# Patient Record
Sex: Female | Born: 1995 | Race: White | Hispanic: No | Marital: Single
Health system: Southern US, Community
[De-identification: ages and names within clinical notes are randomized; demographics above are authoritative.]

## PROBLEM LIST (undated history)

## (undated) DIAGNOSIS — R51 Headache: Secondary | ICD-10-CM

## (undated) HISTORY — DX: Headache: R51

---
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
## 2014-01-03 ENCOUNTER — Telehealth: Payer: Self-pay | Admitting: Pediatrics

## 2014-01-03 NOTE — Telephone Encounter (Signed)
Headache calendar from February 2015 on [name]. 28 days were recorded.  28 days were headache free. There is no reason to change current treatment.  Please contact the patient.

## 2014-01-03 NOTE — Telephone Encounter (Signed)
I spoke with [name] the patient's mom informing her that Dr. Sharene SkeansHickling has reviewed [name]'s February diary and there's no need to make any changes and a reminder to send in March when completed, mom agreed. MB

## 2014-02-04 ENCOUNTER — Telehealth: Payer: Self-pay | Admitting: Pediatrics

## 2014-02-04 NOTE — Telephone Encounter (Signed)
I left a message on the voicemail of [name] the patient's mom informing her that Dr. Sharene SkeansHickling has reviewed Denise Galvan's March diary, there's no need to make any changes, a reminder to send in April when complete and to call the office if she has any questions. MB

## 2014-02-04 NOTE — Telephone Encounter (Signed)
Headache calendar from March 2015 on Denise Galvan. 31 days were recorded.  31 days were headache free.  There is no reason to change current treatment.  Please contact the patient.

## 2014-03-09 ENCOUNTER — Telehealth: Payer: Self-pay | Admitting: Family

## 2014-03-09 NOTE — Telephone Encounter (Signed)
[facility] There were no headaches or migraines recorded for the month of April. Please let Aymara and her mother know that there is no reason to change current treatment. Thank you, Inetta Fermoina

## 2014-03-10 NOTE — Telephone Encounter (Signed)
I left a message on home number regarding headache diary and asked patient or her mother to call back if they had questions or concerns. TG

## 2014-04-05 ENCOUNTER — Telehealth: Payer: Self-pay | Admitting: *Deleted

## 2014-04-05 ENCOUNTER — Encounter: Payer: Self-pay | Admitting: Family

## 2014-04-05 NOTE — Telephone Encounter (Signed)
Denise Galvan, mom, called stating the pt needs a docor's note. The mother stated the pt is going to [employer] in the fall. The mother is trying to get the pt in a certain housing. The letter needs to state that the pt needs to be close to the restroom due to her migraines when she gets sick. The mother spoke to the director of housing at [employer] about getting the doctor's note to him so he can do his best to get her in the right housing. The school is currently in the process of assigning rooms. The mother can be reached at [phone].

## 2014-04-05 NOTE — Telephone Encounter (Signed)
Thanks

## 2014-04-05 NOTE — Telephone Encounter (Signed)
I wrote a letter and emailed it to [employer]. She will call me back if she needs anything else. TG

## 2014-04-06 ENCOUNTER — Telehealth: Payer: Self-pay | Admitting: Pediatrics

## 2014-04-06 NOTE — Telephone Encounter (Signed)
I spoke with Denise Galvan the patient's mom informing her that Dr. Sharene Skeans has reviewed Denise Galvan's May diary and that there's no need to make any changes and a reminder to send in June when complete, mom agreed. MB

## 2014-04-06 NOTE — Telephone Encounter (Signed)
Headache calendar from May 2015 on [place]. 31 days were recorded.  31 days were headache free. There is no reason to change current treatment.  Please contact the patient.

## 2014-05-10 ENCOUNTER — Telehealth: Payer: Self-pay | Admitting: Pediatrics

## 2014-05-10 NOTE — Telephone Encounter (Signed)
Headache calendar from June 2015 on [place]. 30 days were recorded.  29 days were headache free. There was 1 day of migraines, 1 was severe. This came on suddenly pain was holocephalic.  The quality was stabbing and throbbing, 8 on a scale of 10, patient only had 7 hours of sleep the night before, was on her menstrual cycle and had consumed lots of red dye 40, caffeine, and MSG prior to the headache.  She did was not able to take medication because it came on suddenly and had to go to bed. There is no reason to change current treatment.  Please contact the family.

## 2014-05-10 NOTE — Telephone Encounter (Signed)
I spoke with Denise Galvan the patient's mom informing her that Dr. Sharene SkeansHickling has reviewed Denise Galvan's June diary and there's no need to make any changes and a reminder to send in July when complete, mom agreed. MB

## 2014-06-06 ENCOUNTER — Telehealth: Payer: Self-pay | Admitting: Pediatrics

## 2014-06-07 NOTE — Telephone Encounter (Signed)
Headache calendar from July 2015 on [place]. 31 days were recorded.  31 days were headache free.  * There is no reason to change current treatment.  Please contact the family.

## 2014-06-08 NOTE — Telephone Encounter (Signed)
I spoke with [name] the patient's mom informing her that Dr. Sharene SkeansHickling has reviewed Dejah's July diary and there's no need to make any changes and a reminder to send in August when complete, mom agreed. MB

## 2014-07-09 ENCOUNTER — Telehealth: Payer: Self-pay | Admitting: Pediatrics

## 2014-07-09 NOTE — Telephone Encounter (Signed)
Headache calendar from August 2015 on [place]. 31 days were recorded.  31 days were headache free. There is no reason to change current treatment.  Please contact the family.

## 2014-07-13 NOTE — Telephone Encounter (Signed)
I spoke with Denise Galvan the patient's mom informing her that Dr. Sharene Skeans has reviewed Denise Galvan's August diary and there's no need to make any changes and a reminder to send in September when complete, mom agreed. MB

## 2014-09-11 ENCOUNTER — Telehealth: Payer: Self-pay | Admitting: Pediatrics

## 2014-09-11 NOTE — Telephone Encounter (Signed)
Headache calendar from October 2015 on Denise Galvan. 31 days were recorded.  31 days were headache free.  There is no reason to change current treatment.  Please contact the patient.

## 2014-09-12 NOTE — Telephone Encounter (Signed)
I left a message informing patient that Dr. Sharene SkeansHickling has reviewed her headache diary for October and there's no need to make any changes, a reminder to send in November when complete and to call the office if she has any questions. MB

## 2014-11-01 ENCOUNTER — Ambulatory Visit: Payer: Managed Care, Other (non HMO) | Admitting: Family

## 2014-11-07 ENCOUNTER — Encounter: Payer: Self-pay | Admitting: Family

## 2014-11-07 ENCOUNTER — Ambulatory Visit (INDEPENDENT_AMBULATORY_CARE_PROVIDER_SITE_OTHER): Payer: BC Managed Care – PPO | Admitting: Family

## 2014-11-07 VITALS — BP 100/74 | HR 80 | Ht 63.75 in | Wt 132.0 lb

## 2014-11-07 DIAGNOSIS — G44219 Episodic tension-type headache, not intractable: Secondary | ICD-10-CM

## 2014-11-07 DIAGNOSIS — G43109 Migraine with aura, not intractable, without status migrainosus: Secondary | ICD-10-CM

## 2014-11-07 NOTE — Progress Notes (Signed)
Patient: Denise Galvan MRN: [id number] Sex: female DOB: 1996/08/21  Provider: Elveria Rising, NP Location of Care: [facility] Child Neurology  Note type: Routine return visit  History of Present Illness: Referral Source: Dr. Cyril Mourning History from: patient Chief Complaint: Migraines/Headaches  Denise Galvan is a 19 y.o. young woman with history of headaches that have been present since August 2009. She was last seen November 03, 2013. Denise Galvan tells me today that her migraines have not been problematic. She says that her last migraine occurred on November 04, 2014, but she believes that it occurred due to alcohol intake on New Year's Eve. The last migraine prior to that was several months prior to that. When she has a migraine, [name] usually works to relieve the it when she takes it early enough in the migraine event. She also becomes fairly nauseated and vomits with her migraines. Zofran helps to control the vomiting. Denise Galvan has occasional tension headaches that are not severe.   Denise Galvan has been healthy since last seen. 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    Review of Systems: 12 system review was unremarkable  Past Medical History  Diagnosis Date  . Headache(784.0)    Hospitalizations: No., Head Injury: No., Nervous System Infections: No., Immunizations up to date: Yes.   Past Medical History Comments: Denise Galvan's headaches have been present since August 2009. She has migraine with aura involving a visual aura on the left that is probably left visual field lasting 10- 20 minutes for headaches and occurs 100% of the time. Headaches are at the vertex and temples associated with throbbing pain when severe,steady pain when moderate. The tend to occur in the morning and can last for hours. She's never had a headache begin in the middle of the night. She has taken and tolerated an herbal vitamin without side effects. Dr Denise Galvan gave her [name] for  rescue treatment and she reports that when she takes it at the onset of the migraine that she gets relief within 20-30 minutes but she still needs to lie down during that time. If she does not take it at the onset, the migraine still persists for hours. Her mother feels that there is some relationship to her menstrual cycle and that those migraines are more resistant to treatment. Her menses are irregular but she has had some migraines coincide with the onset of the period.   Surgical History History reviewed. No pertinent past surgical history.  Family History family history includes Bladder Cancer in her maternal grandfather; Diabetes in her maternal grandmother; Heart attack in her paternal grandfather; Heart disease in her maternal grandmother; Hyperlipidemia in her father and paternal grandmother; Migraines in her father, maternal aunt, maternal grandmother, and mother; Other in her father and paternal grandfather; Stroke in her paternal grandfather. Family History is otherwise negative for migraines, seizures, cognitive impairment, blindness, deafness, birth defects, chromosomal disorder, autism.  Social History History   Social History  . Marital Status: Single    Spouse Name: N/A    Number of Children: N/A  . Years of Education: N/A   Social History Main Topics  . Smoking status: Never Smoker   . Smokeless tobacco: Never Used  . Alcohol Use: No  . Drug Use: No  . Sexual Activity: No   Other Topics Concern  . None   Social History Narrative   Educational level: university School Attending:[employer] with:  both parents  Hobbies/Interest: basketball  and softball School comments:  Denise Galvan is doing well in school. This is her first year in college and is studying biology.  Physical Exam BP 100/74 mmHg  Pulse 80  Ht 5' 3.75" (1.619 m)  Wt 132 lb (59.875 kg)  BMI 22.84 kg/m2  LMP 11/06/2014 (Exact Date) General: alert, well developed,  well nourished, adolescent girl, in no acute distress Head: normocephalic, no dysmorphic features Ears, Nose and Throat: Otoscopic: tympanic membranes normal . Pharynx: oropharynx is pink without exudates or tonsillar hypertrophy. Neck: supple, full range of motion, no cranial or cervical bruits Respiratory: auscultation clear Cardiovascular: no murmurs, pulses are normal Musculoskeletal: no skeletal deformities or apparent scoliosis Skin: no rashes or neurocutaneous lesions Trunk: no limb deformities or scoliosis  Neurologic Exam  Mental Status: alert; oriented to person, place, and year; knowledge is normal for age; language is normal Cranial Nerves: visual fields are full to double simultaneous stimuli; extraocular movements are full and conjugate; pupils are round reactive to light; funduscopic examination shows sharp disc margins with normal vessels; symmetric facial strength; midline tongue and uvula; hearing intact and symmetric Motor: Normal strength, tone, and mass; good fine motor movements; no pronator drift. Sensory: intact responses to cold, vibration, proprioception and stereognosis  Coordination: good finger-to-nose, rapid repetitive alternating movements and finger apposition  Gait and Station: normal gait and station; patient is able to walk on heels, toes and tandem without difficulty; balance is adequate; Romberg exam is negative; Gower response is negative Reflexes: symmetric and diminished bilaterally; no clonus; bilateral flexor plantar responses.   Assessment and Plan Denise Galvan is a 19 year old young woman with history of headaches that have been present since August 2009. She has occasional tension and migraine headaches. When she has a migraine, [name] works to give her relief. I asked her to continue to keep headache diaries and to send them in monthly. I will see Denise Galvan back in follow up in 6 months or sooner if needed.

## 2014-11-07 NOTE — Patient Instructions (Signed)
Continue keeping headache diaires. Dr. Sharene Skeans or I will call you when we receive diaries to discuss them. Remember to avoid skipping meals, and remember to get enough sleep and to drink enough water. These things will help you to have fewer headaches.   I will see you back in follow up in 6 months or sooner if needed.

## 2014-11-09 ENCOUNTER — Encounter: Payer: Self-pay | Admitting: Family

## 2014-12-05 ENCOUNTER — Telehealth: Payer: Self-pay | Admitting: Pediatrics

## 2014-12-05 NOTE — Telephone Encounter (Signed)
Headache calendar from January 2016 on Denise Galvan. 31 days were recorded.  30 days were headache free. There was 1 day of migraines, none were severe.  There is no reason to change current treatment.  Please contact the family.

## 2014-12-06 NOTE — Telephone Encounter (Signed)
I spoke with [name] the patients mom informing her that Dr. Sharene SkeansHickling has reviewed Denise Galvan's January dairy and there's no need to make any changes and a reminder to send in Feb. When complete, mom agreed. MB

## 2015-01-08 ENCOUNTER — Telehealth: Payer: Self-pay | Admitting: Pediatrics

## 2015-01-08 NOTE — Telephone Encounter (Signed)
Headache calendar from February 2016 on Denise Galvan. 29 days were recorded.  29 days were headache free.  There is no reason to change current treatment.  Please contact the patient.

## 2015-01-09 NOTE — Telephone Encounter (Signed)
I left a voicemail message for [name] the patients mom informing her that Dr. Sharene SkeansHickling has reviewed Mykeisha's February diary and there's no need to make any changes, a reminder to send in March when complete and to call the office if she has any questions. MB

## 2015-02-08 ENCOUNTER — Telehealth: Payer: Self-pay | Admitting: Pediatrics

## 2015-02-08 NOTE — Telephone Encounter (Signed)
Headache calendar from March 2016 on [place]. 31 days were recorded.  30 days were headache free.  There was 1 day of migraines, 1 was severe.  There is no reason to change current treatment.  Please contact the patient.

## 2015-02-13 NOTE — Telephone Encounter (Signed)
I spoke with patient informing her that Dr. Sharene SkeansHickling has reviewed her diary and there's no need to make any changes and a reminder to send in April when complete and she agreed. MB

## 2015-03-18 ENCOUNTER — Telehealth: Payer: Self-pay | Admitting: Pediatrics

## 2015-03-18 NOTE — Telephone Encounter (Signed)
Headache calendar from April 2016 on Denise Galvan. 30 days were recorded.  30 days were headache free. There is no reason to change current treatment.  Please contact the patient.

## 2015-03-21 NOTE — Telephone Encounter (Signed)
I spoke with [name] the patients mom informing her that Dr. Sharene SkeansHickling has reviewed Denise Galvan's April diary and there's no need to make any changes and a reminder to send in May when complete, mom agreed. MB

## 2015-04-06 ENCOUNTER — Telehealth: Payer: Self-pay | Admitting: Pediatrics

## 2015-04-06 NOTE — Telephone Encounter (Signed)
Headache calendar from May 2016 on Denise Galvan. 31 days were recorded.  31 days were headache free. There is no reason to change current treatment.  Please contact the patient.

## 2015-04-21 NOTE — Telephone Encounter (Signed)
I spoke with [name] the patients mom informing her that Dr. Sharene Skeans has reviewed Denise Galvan's May diary and there's no need to make any changes and a reminder to send in June when completed, mom agreed. MB

## 2015-05-09 ENCOUNTER — Ambulatory Visit (INDEPENDENT_AMBULATORY_CARE_PROVIDER_SITE_OTHER): Payer: BLUE CROSS/BLUE SHIELD | Admitting: Family

## 2015-05-09 VITALS — BP 104/74 | HR 90 | Ht 63.5 in | Wt 137.8 lb

## 2015-05-09 DIAGNOSIS — G44219 Episodic tension-type headache, not intractable: Secondary | ICD-10-CM | POA: Diagnosis not present

## 2015-05-09 DIAGNOSIS — G43109 Migraine with aura, not intractable, without status migrainosus: Secondary | ICD-10-CM

## 2015-05-09 NOTE — Progress Notes (Signed)
Patient: Denise Galvan MRN: [id number] Sex: female DOB: Nov 05, 1995  Provider: Elveria Rising, NP Location of Care: [facility] Neurology  Note type: Routine return visit  History of Present Illness: Referral Source: Dr. Cyril Mourning History from: patient and [facility] chart Chief Complaint: Migraines/ Headaches  Denise Galvan is a 19 y.o. girl with history of migraine with aura and tension headaches that have been present since August 2009. She was last seen November 07, 2014. Denise Galvan tells me today that her migraines have not been problematic. She has a headache diary that shows no headaches in the month of June. She had a migraine in February and 1 in April.  When she has a migraine, [name] usually works to relieve the it when she takes it early enough in the migraine event. She also becomes fairly nauseated and vomits with her migraines. Zofran helps to control the vomiting. Denise Galvan has occasional tension headaches that are not severe.   Denise Galvan has been healthy since last seen and has no other health concerns. 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 She will be working during the fall, then starting classes again in January. 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   Review of Systems: Please see the HPI for neurologic and other pertinent review of systems. Otherwise, the following systems are noncontributory including constitutional, eyes, ears, nose and throat, cardiovascular, respiratory, gastrointestinal, genitourinary, musculoskeletal, skin, endocrine, hematologic/lymph, allergic/immunologic and psychiatric.   Past Medical History  Diagnosis Date  . Headache(784.0)    Hospitalizations: No., Head Injury: No., Nervous System Infections: No., Immunizations up to date: Yes.   Past Medical History Comments: Denise Galvan's headaches have been present since August 2009. She has migraine with aura involving a visual  aura on the left that is probably left visual field lasting 10- 20 minutes for headaches and occurs 100% of the time. Headaches are at the vertex and temples associated with throbbing pain when severe,steady pain when moderate. The tend to occur in the morning and can last for hours. She's never had a headache begin in the middle of the night. She has taken and tolerated an herbal vitamin without side effects. Dr Sharene Skeans gave her [name] for rescue treatment and she reports that when she takes it at the onset of the migraine that she gets relief within 20-30 minutes but she still needs to lie down during that time. If she does not take it at the onset, the migraine still persists for hours. Her mother feels that there is some relationship to her menstrual cycle and that those migraines are more resistant to treatment. Her menses are irregular but she has had some migraines coincide with the onset of the period.   Surgical History No past surgical history on file.  Family History family history includes Bladder Cancer in her maternal grandfather; Diabetes in her maternal grandmother; Heart attack in her paternal grandfather; Heart disease in her maternal grandmother; Hyperlipidemia in her father and paternal grandmother; Migraines in her father, maternal aunt, maternal grandmother, and mother; Other in her father and paternal grandfather; Stroke in her paternal grandfather. Family History is otherwise negative for migraines, seizures, cognitive impairment, blindness, deafness, birth defects, chromosomal disorder, autism.  Social History History   Social History  . Marital Status: Single    Spouse Name: N/A  . Number of Children: N/A  . Years of Education: N/A   Social History Main Topics  . Smoking  status: Never Smoker   . Smokeless tobacco: Never Used  . Alcohol Use: No  . Drug Use: No  . Sexual Activity: No   Other Topics Concern  . Not on file   Social History Narrative    Educational level: Not enrolled   Living with:  both parents  Hobbies/Interest: Denise Galvan enjoys work and [employer].  School comments: Denise Galvan is not currently enrolled in school and is working at [employer].  Allergies No Known Allergies  Physical Exam BP 104/74 mmHg  Pulse 90  Ht 5' 3.5" (1.613 m)  Wt 137 lb 12.8 oz (62.506 kg)  BMI 24.02 kg/m2  LMP 04/24/2015 General: alert, well developed, well nourished girl, in no acute distress Head: normocephalic, no dysmorphic features Ears, Nose and Throat: Otoscopic: tympanic membranes normal . Pharynx: oropharynx is pink without exudates or tonsillar hypertrophy. Neck: supple, full range of motion, no cranial or cervical bruits Respiratory: auscultation clear Cardiovascular: no murmurs, pulses are normal Musculoskeletal: no skeletal deformities or apparent scoliosis Skin: no rashes or neurocutaneous lesions Trunk: no limb deformities or scoliosis  Neurologic Exam  Mental Status: alert; oriented to person, place, and year; knowledge is normal for age; language is normal Cranial Nerves: visual fields are full to double simultaneous stimuli; extraocular movements are full and conjugate; pupils are round reactive to light; funduscopic examination shows sharp disc margins with normal vessels; symmetric facial strength; midline tongue and uvula; hearing intact and symmetric Motor: Normal strength, tone, and mass; good fine motor movements; no pronator drift. Sensory: intact responses to touch and temperature Coordination: good finger-to-nose, rapid repetitive alternating movements and finger apposition  Gait and Station: normal gait and station; patient is able to walk on heels, toes and tandem without difficulty; balance is adequate; Romberg exam is negative; Gower response is negative Reflexes: symmetric and diminished bilaterally; no clonus; bilateral flexor plantar responses.  Impression 1. Migraine with aura 2.  Episodic tension headaches  Recommendations for plan of care [facility]. Lachele has neither had nor required imaging or lab studies since the last visit. She is a 19 year old young woman with history of tension headaches and migraine with aura. When she has a migraine, [name] works to give her relief. I asked her to continue to keep headache diaries and to send them in monthly. I will see Jacquita back in follow up in 6 months or sooner if needed.   The medication list was reviewed and reconciled.  No changes were made in the prescribed medications today.  A complete medication list was provided to the patient.   Total time spent with the patient was 15 minutes, of which 50% or more was spent in counseling and coordination of care.

## 2015-05-11 ENCOUNTER — Encounter: Payer: Self-pay | Admitting: Family

## 2015-05-11 NOTE — Patient Instructions (Signed)
Continue to keep headache diaries and send them in monthly. Let me know if your headaches become more frequent or more severe.   Continue to take [name] for migraine relief as you have been doing.   Please plan to return for follow up in January or sooner if needed.

## 2015-06-06 ENCOUNTER — Telehealth: Payer: Self-pay | Admitting: Pediatrics

## 2015-06-06 NOTE — Telephone Encounter (Signed)
Headache calendar from July 2016 on [place]. 31 days were recorded.  31 days were headache free.  There is no reason to change current treatment.  Please contact the patient.

## 2015-06-07 NOTE — Telephone Encounter (Signed)
No, I can't remember the last time she had a migraine.

## 2015-06-07 NOTE — Telephone Encounter (Signed)
I spoke with Denise Galvan the patients mom informing her that Dr. Sharene Skeans has reviewed Denise Galvan's July diary and there's no need to make any changes and a reminder to send in August when complete, mom agreed but had a question, she wants to know if it's necessary for Denise Galvan to continue sending in headache diaries? I informed mom that I would speak with Dr. Sharene Skeans about it and I would get back in contact with her to let her know, she confirmed understanding of our conversation. MB

## 2016-02-09 ENCOUNTER — Encounter: Payer: Self-pay | Admitting: Family

## 2016-03-05 ENCOUNTER — Encounter: Payer: Self-pay | Admitting: Family

## 2016-03-05 ENCOUNTER — Ambulatory Visit (INDEPENDENT_AMBULATORY_CARE_PROVIDER_SITE_OTHER): Payer: BLUE CROSS/BLUE SHIELD | Admitting: Family

## 2016-03-05 VITALS — BP 110/70 | HR 84 | Ht 63.75 in | Wt 142.6 lb

## 2016-03-05 DIAGNOSIS — G44219 Episodic tension-type headache, not intractable: Secondary | ICD-10-CM

## 2016-03-05 DIAGNOSIS — G43109 Migraine with aura, not intractable, without status migrainosus: Secondary | ICD-10-CM

## 2016-03-05 NOTE — Progress Notes (Signed)
Patient: Denise Galvan MRN: [id number] Sex: female DOB: 12/09/1995  Provider: Elveria Risingina Tremain Rucinski, NP [facility]  Note type: Routine return visit  History of Present Illness: Referral Source: Dr. Cyril MourningJack Amos History from: patient, referring office and [facility] Chief Complaint: Migraine  Denise Galvan is a 20 y.o. young woman with history of migraine with aura and tension headaches that have been present since August 2009. She was last seen May 09, 2015. Denise Galvan tells me today that sje has only had 1 or 2 migraines in the past year. When she has a migraine, Excedrin Migraine usually works to relieve the it when she takes it early enough in the migraine event. She has a prescription for [name] but tends to take it only when the migraine is severe. She is unable to identify any specific triggers for her migraines but believes that some may be triggered by her diet or by her menstrual cycle. Denise Galvan has occasional tension headaches that are not severe.   Denise Galvan has been otherwise healthy since last seen and has no other health concerns. She is taking a semester off from college but plans to return in the fall. Denise Galvan is working at a [employer] and plans to continue working there part time when she returns to school.  Review of Systems: Please see the HPI for neurologic and other pertinent review of systems. Otherwise, the following systems are noncontributory including constitutional, eyes, ears, nose and throat, cardiovascular, respiratory, gastrointestinal, genitourinary, musculoskeletal, skin, endocrine, hematologic/lymph, allergic/immunologic and psychiatric.   Past Medical History  Diagnosis Date  . Headache(784.0)    Hospitalizations: No., Head Injury: No., Nervous System Infections: No., Immunizations up to date: Yes.   Past Medical History Comments: Denise Galvan's headaches have been present since August 2009. She has migraine with aura involving a  visual aura on the left that is probably left visual field lasting 10- 20 minutes for headaches and occurs 100% of the time. Headaches are at the vertex and temples associated with throbbing pain when severe,steady pain when moderate. The tend to occur in the morning and can last for hours. She's never had a headache begin in the middle of the night. She has taken and tolerated an herbal vitamin without side effects. Dr Sharene SkeansHickling gave her [name] for rescue treatment and she reports that when she takes it at the onset of the migraine that she gets relief within 20-30 minutes but she still needs to lie down during that time. If she does not take it at the onset, the migraine still persists for hours. Her mother feels that there is some relationship to her menstrual cycle and that those migraines are more resistant to treatment. Her menses are irregular but she has had some migraines coincide with the onset of the period.    Surgical History History reviewed. No pertinent past surgical history.  Family History family history includes Bladder Cancer in her maternal grandfather; Diabetes in her maternal grandmother; Heart attack in her paternal grandfather; Heart disease in her maternal grandmother; Hyperlipidemia in her father and paternal grandmother; Migraines in her father, maternal aunt, maternal grandmother, and mother; Other in her father and paternal grandfather; Stroke in her paternal grandfather. Family History is otherwise negative for migraines, seizures, cognitive impairment, blindness, deafness, birth defects, chromosomal disorder, autism.  Social History Social History   Social History  . Marital Status: Single    Spouse Name: N/A  . Number of Children: N/A  . Years of Education:  N/A   Social History Main Topics  . Smoking status: Passive Smoke Exposure - Never Smoker  . Smokeless tobacco: Never Used  . Alcohol Use: No  . Drug Use: No  . Sexual Activity: Yes    Birth Control/  Protection: IUD     Comment: Mother smokes   Other Topics Concern  . 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   Lives with her parents and sibling.    Allergies No Known Allergies  Physical Exam BP 110/70 mmHg  Pulse 84  Ht 5' 3.75" (1.619 m)  Wt 142 lb 9.6 oz (64.683 kg)  BMI 24.68 kg/m2  LMP 02/20/2016 (Exact Date) General: alert, well developed, well nourished girl, in no acute distress Head: normocephalic, no dysmorphic features Ears, Nose and Throat: Otoscopic: tympanic membranes normal . Pharynx: oropharynx is pink without exudates or tonsillar hypertrophy. Neck: supple, full range of motion, no cranial or cervical bruits Respiratory: auscultation clear Cardiovascular: no murmurs, pulses are normal Musculoskeletal: no skeletal deformities or apparent scoliosis Skin: no rashes or neurocutaneous lesions Trunk: no limb deformities or scoliosis  Neurologic Exam  Mental Status: alert; oriented to person, place, and year; knowledge is normal for age; language is normal Cranial Nerves: visual fields are full to double simultaneous stimuli; extraocular movements are full and conjugate; pupils are round reactive to light; funduscopic examination shows sharp disc margins with normal vessels; symmetric facial strength; midline tongue and uvula; hearing intact and symmetric Motor: Normal strength, tone, and mass; good fine motor movements; no pronator drift. Sensory: intact responses to touch and temperature Coordination: good finger-to-nose, rapid repetitive alternating movements and finger apposition  Gait and Station: normal gait and station; patient is able to walk on heels, toes and tandem without difficulty; balance is adequate; Romberg exam is negative; Gower response is negative Reflexes: symmetric and diminished  bilaterally; no clonus; bilateral flexor plantar responses.  Impression 1. Migraine with aura 2. Episodic tension headaches  Recommendations for plan of care The patient's previous [facility] records were reviewed. Ailen has neither had nor required imaging or lab studies since the last visit. She is a 20 year old young woman with history of tension headaches and migraine with aura. She estimates that she has only had 1 or 2 migraines in the past year. When she has a migraine, Excedrin Migraine gives her relief in a short period of time. I talked with Denise Galvan about her headaches and reminded her of usual triggers such as skipped meals, not drinking enough water, insufficient sleep and stress. I asked Denise Galvan to let me know if her migraines became more frequent or more severe. Since she is currently doing well, I will see her back in follow up in 1 year or sooner if needed. Denise Galvan agreed with these plans.   The medication list was reviewed and reconciled.  No changes were made in the prescribed medications today.  A complete medication list was provided to the patient.    Medication List       This list is accurate as of: 03/05/16  3:11 PM.  Always use your most recent med list.               EXCEDRIN MIGRAINE 250-250-65 MG tablet  Generic drug:  aspirin-acetaminophen-caffeine  Take 2 tablets by mouth every 6 (six) hours as needed for headache.  rizatriptan 10 MG tablet  Commonly known as:  [name]  Take 10 mg by mouth as needed for migraine. May repeat in 2 hours if needed     SKYLA 13.5 MG Iud  Generic drug:  Levonorgestrel  by Intrauterine route.        Total time spent with the patient was 15 minutes, of which 50% or more was spent in counseling and coordination of care.   Elveria Rising

## 2016-03-05 NOTE — Patient Instructions (Addendum)
I am pleased that your migraines are infrequent at this time. Let me know if your headaches become more frequent or more severe.   Remember to try to avoid typical triggers for migraines such as skipping meals, not drinking enough water, not getting enough sleep (or sleeping too much), and stress.   Consider signing up for MyChart - your online access to your electronic medical record.   Please plan on returning for follow up in 1 year or sooner if needed.

## 2016-04-16 DIAGNOSIS — N760 Acute vaginitis: Secondary | ICD-10-CM | POA: Diagnosis not present

## 2016-04-16 DIAGNOSIS — Z6824 Body mass index (BMI) 24.0-24.9, adult: Secondary | ICD-10-CM | POA: Diagnosis not present

## 2016-04-16 DIAGNOSIS — Z01419 Encounter for gynecological examination (general) (routine) without abnormal findings: Secondary | ICD-10-CM | POA: Diagnosis not present

## 2016-06-25 DIAGNOSIS — N390 Urinary tract infection, site not specified: Secondary | ICD-10-CM | POA: Diagnosis not present

## 2016-06-25 DIAGNOSIS — N760 Acute vaginitis: Secondary | ICD-10-CM | POA: Diagnosis not present

## 2017-05-28 DIAGNOSIS — Z30433 Encounter for removal and reinsertion of intrauterine contraceptive device: Secondary | ICD-10-CM | POA: Diagnosis not present

## 2017-06-20 DIAGNOSIS — Z30431 Encounter for routine checking of intrauterine contraceptive device: Secondary | ICD-10-CM | POA: Diagnosis not present

## 2017-06-20 DIAGNOSIS — R1032 Left lower quadrant pain: Secondary | ICD-10-CM | POA: Diagnosis not present

## 2017-07-04 DIAGNOSIS — N83201 Unspecified ovarian cyst, right side: Secondary | ICD-10-CM | POA: Diagnosis not present

## 2017-10-23 DIAGNOSIS — Z01419 Encounter for gynecological examination (general) (routine) without abnormal findings: Secondary | ICD-10-CM | POA: Diagnosis not present

## 2017-10-23 DIAGNOSIS — Z6826 Body mass index (BMI) 26.0-26.9, adult: Secondary | ICD-10-CM | POA: Diagnosis not present

## 2017-10-23 DIAGNOSIS — Z113 Encounter for screening for infections with a predominantly sexual mode of transmission: Secondary | ICD-10-CM | POA: Diagnosis not present

## 2017-10-23 LAB — HM PAP SMEAR: HM Pap smear: NEGATIVE

## 2018-01-02 DIAGNOSIS — L700 Acne vulgaris: Secondary | ICD-10-CM | POA: Diagnosis not present

## 2018-01-02 DIAGNOSIS — D235 Other benign neoplasm of skin of trunk: Secondary | ICD-10-CM | POA: Diagnosis not present

## 2018-01-26 DIAGNOSIS — N946 Dysmenorrhea, unspecified: Secondary | ICD-10-CM | POA: Diagnosis not present

## 2018-01-26 DIAGNOSIS — G43909 Migraine, unspecified, not intractable, without status migrainosus: Secondary | ICD-10-CM | POA: Diagnosis not present

## 2018-01-26 DIAGNOSIS — Z3009 Encounter for other general counseling and advice on contraception: Secondary | ICD-10-CM | POA: Diagnosis not present

## 2018-02-25 ENCOUNTER — Other Ambulatory Visit: Payer: Self-pay | Admitting: Family Medicine

## 2018-02-25 DIAGNOSIS — R109 Unspecified abdominal pain: Secondary | ICD-10-CM | POA: Diagnosis not present

## 2018-02-25 DIAGNOSIS — R102 Pelvic and perineal pain: Secondary | ICD-10-CM

## 2018-02-26 ENCOUNTER — Ambulatory Visit
Admission: RE | Admit: 2018-02-26 | Discharge: 2018-02-26 | Disposition: A | Discharge status: Home / Self Care | Payer: BLUE CROSS/BLUE SHIELD | Source: Ambulatory Visit | Attending: Family Medicine | Admitting: Family Medicine

## 2018-02-26 DIAGNOSIS — R102 Pelvic and perineal pain: Secondary | ICD-10-CM

## 2018-02-26 DIAGNOSIS — N83202 Unspecified ovarian cyst, left side: Secondary | ICD-10-CM | POA: Diagnosis not present

## 2018-03-13 ENCOUNTER — Telehealth: Payer: Self-pay | Admitting: Nurse Practitioner

## 2018-03-13 NOTE — Telephone Encounter (Signed)
Copied from CRM [id number]. Topic: Appointment Scheduling - Scheduling Inquiry for Clinic >> Mar 13, 2018  1:29 PM Diana Eves B wrote: Reason for CRM: pt's mom is calling in to see if Apolonio Schneiders would consider taking on her daughter as a new pt. Her other daughter is already a patient of [facility]leigh's.

## 2018-03-14 NOTE — Telephone Encounter (Signed)
Yes I will be glad to 

## 2018-03-16 NOTE — Telephone Encounter (Signed)
Appointment has been made for 7/12 @ 10am. - New patient paperwork has been mailed.

## 2018-04-10 DIAGNOSIS — L700 Acne vulgaris: Secondary | ICD-10-CM | POA: Diagnosis not present

## 2018-04-16 DIAGNOSIS — Z3009 Encounter for other general counseling and advice on contraception: Secondary | ICD-10-CM | POA: Diagnosis not present

## 2018-04-16 DIAGNOSIS — Z30432 Encounter for removal of intrauterine contraceptive device: Secondary | ICD-10-CM | POA: Diagnosis not present

## 2018-04-16 DIAGNOSIS — G43101 Migraine with aura, not intractable, with status migrainosus: Secondary | ICD-10-CM | POA: Diagnosis not present

## 2018-04-16 DIAGNOSIS — Z30013 Encounter for initial prescription of injectable contraceptive: Secondary | ICD-10-CM | POA: Diagnosis not present

## 2018-05-15 ENCOUNTER — Ambulatory Visit: Payer: BLUE CROSS/BLUE SHIELD | Admitting: Nurse Practitioner

## 2018-05-28 ENCOUNTER — Encounter: Payer: Self-pay | Admitting: Nurse Practitioner

## 2018-05-28 ENCOUNTER — Ambulatory Visit: Payer: BLUE CROSS/BLUE SHIELD | Admitting: Nurse Practitioner

## 2018-05-28 VITALS — BP 108/70 | HR 70 | Temp 98.6°F | Resp 16 | Ht 63.75 in | Wt 148.0 lb

## 2018-05-28 DIAGNOSIS — M545 Low back pain, unspecified: Secondary | ICD-10-CM | POA: Insufficient documentation

## 2018-05-28 DIAGNOSIS — G43109 Migraine with aura, not intractable, without status migrainosus: Secondary | ICD-10-CM

## 2018-05-28 DIAGNOSIS — L709 Acne, unspecified: Secondary | ICD-10-CM | POA: Insufficient documentation

## 2018-05-28 DIAGNOSIS — F419 Anxiety disorder, unspecified: Secondary | ICD-10-CM | POA: Insufficient documentation

## 2018-05-28 NOTE — Patient Instructions (Signed)
Please Return at your convenience for an annual physical or as needed for any problems  It was nice to meet you. Welcome to [facility]!

## 2018-05-28 NOTE — Assessment & Plan Note (Signed)
Continue routine dermatology follow up and as needed for new, worsening symptoms

## 2018-05-28 NOTE — Assessment & Plan Note (Signed)
Stable Continue routine neurology follow up and as needed for new, worsening symptoms

## 2018-05-28 NOTE — Progress Notes (Signed)
Name: Denise Galvan   MRN: [id number]    DOB: 1996/10/25   Date:05/28/2018       Progress Note  Subjective  Chief Complaint  Chief Complaint  Patient presents with  . Establish Care    HPI [name] is here today to establish care as a new patient to our practice, transferring from another PCP due to desire for female provider. Aside from primary care, she is routinely followed by GYN for routine womens care and birth control management, currently maintained on depo-provera; dermatology for management of acne, currently maintained on doxycyline; and neurology for migraines with aura, currently maintained off prescription medications. She Reports long history of migraines which have greatly improved over recent years. She is maintained on ibuprofen PRN for the migraines, but usually does not take any medication at all and the migraines will improve with time and rest.  Patient Active Problem List   Diagnosis Date Noted  . Migraine with aura 11/01/2013  . Episodic tension type headache 11/01/2013    No past surgical history on file.  Family History  Problem Relation Age of Onset  . Hyperlipidemia Father   . Migraines Father   . Other Father        Dyslipidemia  . Heart disease Maternal Grandmother   . Migraines Maternal Grandmother   . Diabetes Maternal Grandmother   . Hyperlipidemia Paternal Grandmother   . Migraines Mother        Onset between the age of 42 or 59  . Bladder Cancer Maternal Grandfather        Died at 24  . Heart attack Paternal Grandfather        Died at 76  . Other Paternal Grandfather        Staph infection  . Stroke Paternal Grandfather   . Migraines Maternal Aunt     Social History   Socioeconomic History  . Marital status: Single    Spouse name: Not on file  . Number of children: Not on file  . Years of education: Not on file  . Highest education level: Not on file  Occupational History  . Not on file  Social Needs  . Financial resource  strain: Not on file  . Food insecurity:    Worry: Not on file    Inability: Not on file  . Transportation needs:    Medical: Not on file    Non-medical: Not on file  Tobacco Use  . Smoking status: Passive Smoke Exposure - Never Smoker  . Smokeless tobacco: Never Used  Substance and Sexual Activity  . Alcohol use: No    Alcohol/week: 0.0 oz  . Drug use: No  . Sexual activity: Yes    Birth control/protection: IUD    Comment: Mother smokes  Lifestyle  . Physical activity:    Days per week: Not on file    Minutes per session: Not on file  . Stress: Not on file  Relationships  . Social connections:    Talks on phone: Not on file    Gets together: Not on file    Attends religious service: Not on file    Active member of club or organization: Not on file    Attends meetings of clubs or organizations: Not on file    Relationship status: Not on file  . Intimate partner violence:    Fear of current or ex partner: Not on file    Emotionally abused: Not on file    Physically abused: Not  on file    Forced sexual activity: Not on file  Other Topics Concern  . Not on file  Social History Narrative   Denise Galvan graduated from [employer] in 2015. 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   Lives with her parents and sibling.     Current Outpatient Medications:  .  Denise Galvan (VIBRAMYCIN) 100 MG capsule, Take 100 mg by mouth 2 (two) times daily., Disp: , Rfl:  .  ibuprofen (ADVIL,MOTRIN) 200 MG tablet, Take 200 mg by mouth every 6 (six) hours as needed., Disp: , Rfl:  .  medroxyPROGESTERone Acetate (DEPO-PROVERA IM), Inject into the muscle., Disp: , Rfl:   No Known Allergies   Review of Systems  Constitutional: Negative for chills and fever.  HENT: Negative for hearing loss and sore throat.   Eyes: Negative for blurred vision and double vision.  Respiratory: Negative for cough and wheezing.    Cardiovascular: Negative for chest pain.  Gastrointestinal: Negative for constipation, diarrhea, nausea and vomiting.  Genitourinary: Negative for dysuria, frequency and hematuria.  Musculoskeletal: Positive for back pain. Negative for falls.  Skin: Negative for rash.  Neurological: Negative for speech change and weakness.  Endo/Heme/Allergies: Negative for environmental allergies. Does not bruise/bleed easily.  Psychiatric/Behavioral: Negative for depression. The patient is nervous/anxious.    Back pain-  This is not a new problem. 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 This problem does not occur daily.  Objective  Vitals:   05/28/18 0824  BP: 108/70  Pulse: 70  Resp: 16  Temp: 98.6 F (37 C)  TempSrc: Oral  SpO2: 98%  Weight: 148 lb (67.1 kg)  Height: 5' 3.75" (1.619 m)    Body mass index is 25.6 kg/m.  Physical Exam Vital signs reviewed. Constitutional: Patient appears well-developed and well-nourished. No distress.  HENT: Head: Normocephalic and atraumatic. Nose: Nose normal. Mouth/Throat: Oropharynx is clear and moist. No oropharyngeal exudate.  Eyes: Conjunctivae and EOM are normal. Pupils are equal, round, and reactive to light. No scleral icterus.  Neck: Normal range of motion. Neck supple. Cardiovascular: Normal rate, regular rhythm and normal heart sounds. Distal pulses intact. Pulmonary/Chest: Effort normal and breath sounds normal. No respiratory distress. Musculoskeletal: Normal range of motion, No gross deformities Neurological: She is alert and  oriented to person, place, and time. Coordination, balance, strength, speech and gait are normal.  Skin: Skin is warm and dry. No rash noted. No erythema.  Psychiatric: Patient has a normal mood and affect. behavior is normal. Judgment and thought content normal.  PHQ2/9: Depression screen PHQ 2/9 05/28/2018  Decreased Interest 0  Down, Depressed, Hopeless 0  PHQ - 2 Score 0    Fall Risk: Fall Risk  05/28/2018  Falls in the past year? No    Assessment & Plan RTC for CPE and/or as needed for acute problems  Low back pain, unspecified back pain laterality, unspecified chronicity, with sciatica presence unspecified Noted ON ROS Stable No further workup or treatment indicated at this time Follow up for new, worsening symptoms  Anxiety Noted on ROS Stable No further workup or treatment indicated at this time Follow up for new, worsening symptoms

## 2018-07-08 DIAGNOSIS — Z3042 Encounter for surveillance of injectable contraceptive: Secondary | ICD-10-CM | POA: Diagnosis not present

## 2018-07-23 DIAGNOSIS — M79642 Pain in left hand: Secondary | ICD-10-CM | POA: Diagnosis not present

## 2018-08-19 DIAGNOSIS — M25531 Pain in right wrist: Secondary | ICD-10-CM | POA: Diagnosis not present

## 2018-09-23 DIAGNOSIS — Z3042 Encounter for surveillance of injectable contraceptive: Secondary | ICD-10-CM | POA: Diagnosis not present

## 2018-09-30 DIAGNOSIS — M25531 Pain in right wrist: Secondary | ICD-10-CM | POA: Diagnosis not present

## 2018-11-11 ENCOUNTER — Encounter: Payer: Self-pay | Admitting: Nurse Practitioner

## 2018-11-11 ENCOUNTER — Ambulatory Visit: Payer: BLUE CROSS/BLUE SHIELD | Admitting: Nurse Practitioner

## 2018-11-11 VITALS — BP 120/86 | HR 101 | Temp 98.6°F | Ht 63.75 in | Wt 164.0 lb

## 2018-11-11 DIAGNOSIS — J029 Acute pharyngitis, unspecified: Secondary | ICD-10-CM | POA: Diagnosis not present

## 2018-11-11 DIAGNOSIS — J069 Acute upper respiratory infection, unspecified: Secondary | ICD-10-CM | POA: Diagnosis not present

## 2018-11-11 LAB — POCT RAPID STREP A (OFFICE): RAPID STREP A SCREEN: NEGATIVE

## 2018-11-11 NOTE — Progress Notes (Addendum)
Denise Galvan is a 23 y.o. female with the following history as recorded in EpicCare:  Patient Active Problem List   Diagnosis Date Noted  . Anxiety 05/28/2018  . Low back pain 05/28/2018  . Acne 05/28/2018  . Migraine with aura 11/01/2013  . Episodic tension type headache 11/01/2013    Current Outpatient Medications  Medication Sig Dispense Refill  . ibuprofen (ADVIL,MOTRIN) 200 MG tablet Take 200 mg by mouth every 6 (six) hours as needed.    . medroxyPROGESTERone Acetate (DEPO-PROVERA IM) Inject into the muscle.    Marland Kitchen. doxycycline (VIBRAMYCIN) 100 MG capsule Take 100 mg by mouth 2 (two) times daily.     No current facility-administered medications for this visit.     Allergies: Patient has no known allergies.  Past Medical History:  Diagnosis Date  . Headache(784.0)     History reviewed. No pertinent surgical history.  Family History  Problem Relation Age of Onset  . Hyperlipidemia Father   . Migraines Father   . Other Father        Dyslipidemia  . Heart disease Maternal Grandmother   . Migraines Maternal Grandmother   . Diabetes Maternal Grandmother   . Hyperlipidemia Paternal Grandmother   . Migraines Mother        Onset between the age of 219 or 6820  . Bladder Cancer Maternal Grandfather        Died at 6950  . Heart attack Paternal Grandfather        Died at 6654  . Other Paternal Grandfather        Staph infection  . Stroke Paternal Grandfather   . Migraines Maternal Aunt     Social History   Tobacco Use  . Smoking status: Passive Smoke Exposure - Never Smoker  . Smokeless tobacco: Never Used  Substance Use Topics  . Alcohol use: No    Alcohol/week: 0.0 standard drinks     Subjective:  Denise Galvan is here today  requesting evaluation of acute complaint of cough/cold symptoms, which first began about 4 days ago this past Sunday with sore throat, which has persisted since onset, and also developed dry cough over past couple days. Reports: headaches, sinus  pressure, nasal congestion and drainage, sneezing Denies: fevers, chills, syncope, confusion, loss of appetite, ear pain or pressure, eye discharge, cp, sob, n/v/d Smoker? No Many people at her job with similar symptoms Tried at home: dayquil, nyquil - no relief Feels her symptoms are getting worse since onset  ROS - See HPI  Objective:  Vitals:   11/11/18 1300  BP: 120/86  Pulse: (!) 101  Temp: 98.6 F (37 C)  TempSrc: Oral  SpO2: 98%  Weight: 164 lb (74.4 kg)  Height: 5' 3.75" (1.619 m)    General: Well developed, well nourished, in no acute distress  Skin : Warm and dry.  Head: Normocephalic and atraumatic  Eyes: Sclera and conjunctiva clear; pupils round and reactive to light; extraocular movements intact  Ears: External normal; canals clear; tympanic membranes bulging bilaterally, without erythema or effusion Oropharynx: oropharynx erythematous, without exudate or edema. No suspicious lesions  Neck: Supple without thyromegaly, adenopathy  Lungs: Respirations unlabored; clear to auscultation bilaterally without wheeze, rales, rhonchi  CVS exam: normal rate, regular rhythm, normal S1, S2 Extremities: No edema, cyanosis Vessels: Symmetric bilaterally  Neurologic: Alert and oriented; speech intact; face symmetrical; moves all extremities well; CNII-XII intact without focal deficit  Psychiatric: Normal mood and affect.   Assessment:  1. Sore throat  2. Viral URI     Plan:   POCT strep negative Home management, OTC medications, red flags and return precautions including when to seek follow up/immediate care discussed and printed on AVS  No follow-ups on file.  Orders Placed This Encounter  Procedures  . POCT rapid strep A    Requested Prescriptions    No prescriptions requested or ordered in this encounter

## 2018-11-11 NOTE — Patient Instructions (Signed)
For nasal and head congestion may take Sudafed PE 10 mg every 4 hour as needed. Saline nasal spray used frequently. For drainage may use Allegra, Claritin or Zyrtec. If you need stronger medicine to stop drainage may take Chlor-Trimeton 2-4 mg every 4 hours. This may cause drowsiness. Honey for sore throat. Ibuprofen 600 mg every 6 hours as needed for pain, discomfort or fever. Drink plenty of fluids and stay well-hydrated.   Please follow up for fevers over 101, if your symptoms get worse, or if your symptoms aren't better in 1 week.   Upper Respiratory Infection, Adult An upper respiratory infection (URI) affects the nose, throat, and upper air passages. URIs are caused by germs (viruses). The most common type of URI is often called "the common cold." Medicines cannot cure URIs, but you can do things at home to relieve your symptoms. URIs usually get better within 7-10 days. Follow these instructions at home: Activity  Rest as needed.  If you have a fever, stay home from work or school until your fever is gone, or until your doctor says you may return to work or school. ? You should stay home until you cannot spread the infection anymore (you are not contagious). ? Your doctor may have you wear a face mask so you have less risk of spreading the infection. Relieving symptoms  Gargle with a salt-water mixture 3-4 times a day or as needed. To make a salt-water mixture, completely dissolve -1 tsp of salt in 1 cup of warm water.  Use a cool-mist humidifier to add moisture to the air. This can help you breathe more easily. Eating and drinking   Drink enough fluid to keep your pee (urine) pale yellow.  Eat soups and other clear broths. General instructions   Take over-the-counter and prescription medicines only as told by your doctor. These include cold medicines, fever reducers, and cough suppressants.  Do not use any products that contain nicotine or tobacco. These include  cigarettes and e-cigarettes. If you need help quitting, ask your doctor.  Avoid being where people are smoking (avoid secondhand smoke).  Make sure you get regular shots and get the flu shot every year.  Keep all follow-up visits as told by your doctor. This is important. How to avoid spreading infection to others   Wash your hands often with soap and water. If you do not have soap and water, use hand sanitizer.  Avoid touching your mouth, face, eyes, or nose.  Cough or sneeze into a tissue or your sleeve or elbow. Do not cough or sneeze into your hand or into the air. Contact a doctor if:  You are getting worse, not better.  You have any of these: ? A fever. ? Chills. ? Brown or red mucus in your nose. ? Yellow or brown fluid (discharge)coming from your nose. ? Pain in your face, especially when you bend forward. ? Swollen neck glands. ? Pain with swallowing. ? White areas in the back of your throat. Get help right away if:  You have shortness of breath that gets worse.  You have very bad or constant: ? Headache. ? Ear pain. ? Pain in your forehead, behind your eyes, and over your cheekbones (sinus pain). ? Chest pain.  You have long-lasting (chronic) lung disease along with any of these: ? Wheezing. ? Long-lasting cough. ? Coughing up blood. ? A change in your usual mucus.  You have a stiff neck.  You have changes in your: ? Vision. ?  Hearing. ? Thinking. ? Mood. Summary  An upper respiratory infection (URI) is caused by a germ called a virus. The most common type of URI is often called "the common cold."  URIs usually get better within 7-10 days.  Take over-the-counter and prescription medicines only as told by your doctor. This information is not intended to replace advice given to you by your health care provider. Make sure you discuss any questions you have with your health care provider. Document Released: 04/08/2008 Document Revised: 06/13/2017  Document Reviewed: 06/13/2017 Elsevier Interactive Patient Education  2019 [employer].

## 2018-12-02 ENCOUNTER — Other Ambulatory Visit (INDEPENDENT_AMBULATORY_CARE_PROVIDER_SITE_OTHER): Payer: BLUE CROSS/BLUE SHIELD

## 2018-12-02 ENCOUNTER — Encounter: Payer: Self-pay | Admitting: Nurse Practitioner

## 2018-12-02 ENCOUNTER — Ambulatory Visit (INDEPENDENT_AMBULATORY_CARE_PROVIDER_SITE_OTHER): Payer: BLUE CROSS/BLUE SHIELD | Admitting: Nurse Practitioner

## 2018-12-02 VITALS — BP 112/88 | HR 81 | Ht 63.75 in | Wt 164.0 lb

## 2018-12-02 DIAGNOSIS — Z0000 Encounter for general adult medical examination without abnormal findings: Secondary | ICD-10-CM | POA: Insufficient documentation

## 2018-12-02 DIAGNOSIS — Z13220 Encounter for screening for lipoid disorders: Secondary | ICD-10-CM

## 2018-12-02 DIAGNOSIS — Z23 Encounter for immunization: Secondary | ICD-10-CM

## 2018-12-02 LAB — COMPREHENSIVE METABOLIC PANEL
ALT: 13 U/L (ref 0–35)
AST: 13 U/L (ref 0–37)
Albumin: 4.3 g/dL (ref 3.5–5.2)
Alkaline Phosphatase: 58 U/L (ref 39–117)
BUN: 12 mg/dL (ref 6–23)
CO2: 25 mEq/L (ref 19–32)
Calcium: 9.8 mg/dL (ref 8.4–10.5)
Chloride: 105 mEq/L (ref 96–112)
Creatinine, Ser: 0.8 mg/dL (ref 0.40–1.20)
GFR: 88.58 mL/min (ref >60.00)
Glucose, Bld: 99 mg/dL (ref 70–99)
Potassium: 4.8 mEq/L (ref 3.5–5.1)
Sodium: 138 mEq/L (ref 135–145)
Total Bilirubin: 0.5 mg/dL (ref 0.2–1.2)
Total Protein: 6.8 g/dL (ref 6.0–8.3)

## 2018-12-02 LAB — LIPID PANEL
Cholesterol: 189 mg/dL (ref 0–200)
HDL: 37.4 mg/dL — ABNORMAL LOW
LDL Cholesterol: 133 mg/dL — ABNORMAL HIGH (ref 0–99)
NonHDL: 151.53
Total CHOL/HDL Ratio: 5
Triglycerides: 92 mg/dL (ref 0.0–149.0)
VLDL: 18.4 mg/dL (ref 0.0–40.0)

## 2018-12-02 LAB — CBC
HCT: 42.9 % (ref 36.0–46.0)
Hemoglobin: 14.6 g/dL (ref 12.0–15.0)
MCHC: 34 g/dL (ref 30.0–36.0)
MCV: 89.3 fl (ref 78.0–100.0)
Platelets: 466 10*3/uL — ABNORMAL HIGH (ref 150.0–400.0)
RBC: 4.8 Mil/uL (ref 3.87–5.11)
RDW: 12 % (ref 11.5–15.5)
WBC: 8.2 10*3/uL (ref 4.0–10.5)

## 2018-12-02 NOTE — Progress Notes (Signed)
Denise Galvan is a 23 y.o. female with the following history as recorded in [employer]:  Patient Active Problem List   Diagnosis Date Noted  . Anxiety 05/28/2018  . Low back pain 05/28/2018  . Acne 05/28/2018  . Migraine with aura 11/01/2013  . Episodic tension type headache 11/01/2013    Current Outpatient Medications  Medication Sig Dispense Refill  . ibuprofen (ADVIL,MOTRIN) 200 MG tablet Take 200 mg by mouth every 6 (six) hours as needed.    . medroxyPROGESTERone Acetate (DEPO-PROVERA IM) Inject into the muscle.     No current facility-administered medications for this visit.     Allergies: Patient has no known allergies.  Past Medical History:  Diagnosis Date  . Headache(784.0)     History reviewed. No pertinent surgical history.  Family History  Problem Relation Age of Onset  . Hyperlipidemia Father   . Migraines Father   . Other Father        Dyslipidemia  . Heart disease Maternal Grandmother   . Migraines Maternal Grandmother   . Diabetes Maternal Grandmother   . Hyperlipidemia Paternal Grandmother   . Migraines Mother        Onset between the age of 62 or 44  . Bladder Cancer Maternal Grandfather        Died at 56  . Heart attack Paternal Grandfather        Died at 46  . Other Paternal Grandfather        Staph infection  . Stroke Paternal Grandfather   . Migraines Maternal Aunt     Social History   Tobacco Use  . Smoking status: Passive Smoke Exposure - Never Smoker  . Smokeless tobacco: Never Used  Substance Use Topics  . Alcohol use: No    Alcohol/week: 0.0 standard drinks     Subjective:  Denise Galvan is here today for CPE.  Last dental exam: 2019 Last vision exam: no routine eye exam PAP: up to date by GYN, [facility] Lipids: family hx HLD, lipid panel ordered DM screening: not indicated Vaccinations: tdap today Diet and exercise: trying to reduce snacks and pick healthy food options, thinking of hiking more when the weather warms  up   Review of Systems  Constitutional: Negative for chills, fever and weight loss.  HENT: Negative for hearing loss.   Eyes: Negative for blurred vision and double vision.  Respiratory: Negative for cough and shortness of breath.   Cardiovascular: Negative for chest pain and palpitations.  Gastrointestinal: Negative for abdominal pain, constipation and diarrhea.  Genitourinary: Negative for dysuria and hematuria.  Musculoskeletal: Negative for falls.  Skin: Negative for rash.  Neurological: Negative for dizziness, speech change, loss of consciousness and weakness.  Endo/Heme/Allergies: Does not bruise/bleed easily.  Psychiatric/Behavioral: Negative for depression. The patient is not nervous/anxious.     Objective:  Vitals:   12/02/18 0822  BP: 112/88  Pulse: 81  SpO2: 98%  Weight: 164 lb (74.4 kg)  Height: 5' 3.75" (1.619 m)  Body mass index is 28.37 kg/m.   General: Well developed, well nourished, in no acute distress  Skin : Warm and dry.  Head: Normocephalic and atraumatic  Eyes: Sclera and conjunctiva clear; pupils round and reactive to light; extraocular movements intact  Ears: External normal; canals clear; tympanic membranes normal  Oropharynx: Pink, supple. No suspicious lesions  Neck: Supple without thyromegaly, adenopathy  Lungs: Respirations unlabored; clear to auscultation bilaterally without wheeze, rales, rhonchi  CVS exam: normal rate and regular rhythm, S1  and S2 normal.  Abdomen: Soft; nontender; nondistended; normoactive bowel sounds; no masses or hepatosplenomegaly  Musculoskeletal: No deformities; no active joint inflammation  Extremities: No edema, cyanosis, clubbing  Vessels: Symmetric bilaterally  Neurologic: Alert and oriented; speech intact; face symmetrical; moves all extremities well; CNII-XII intact without focal deficit  Psychiatric: Normal mood and affect.   Assessment:  1. Routine general medical examination at a health care facility    2. Screening for cholesterol level   3. Need for Tdap vaccination     Plan:   Return in about 1 year (around 12/03/2019) for CPE.  Orders Placed This Encounter  Procedures  . Tdap vaccine greater than or equal to 7yo IM  . CBC    Standing Status:   Future    Number of Occurrences:   1    Standing Expiration Date:   12/03/2019  . Comprehensive metabolic panel    Standing Status:   Future    Number of Occurrences:   1    Standing Expiration Date:   12/03/2019  . Lipid panel    Standing Status:   Future    Number of Occurrences:   1    Standing Expiration Date:   12/03/2019    Requested Prescriptions    No prescriptions requested or ordered in this encounter

## 2018-12-02 NOTE — Assessment & Plan Note (Addendum)
Reviewed annual screening exams, healthy lifestyle, weight loss,  additional information provided on AVS Will request records from GYN to update chart - CBC; Future - Comprehensive metabolic panel; Future - Lipid panel; Future  Screening for cholesterol level She is fasting - Lipid panel; Future  Need for Tdap vaccination - Tdap vaccine greater than or equal to 24yo IM

## 2018-12-02 NOTE — Patient Instructions (Addendum)
Head downstairs for labs work  I will see you back in 1 year, or sooner if needed!  Health Maintenance, Female Adopting a healthy lifestyle and getting preventive care can go a long way to promote health and wellness. Talk with your health care provider about what schedule of regular examinations is right for you. This is a good chance for you to check in with your provider about disease prevention and staying healthy. In between checkups, there are plenty of things you can do on your own. Experts have done a lot of research about which lifestyle changes and preventive measures are most likely to keep you healthy. Ask your health care provider for more information. Weight and diet Eat a healthy diet  Be sure to include plenty of vegetables, fruits, low-fat dairy products, and lean protein.  Do not eat a lot of foods high in solid fats, added sugars, or salt.  Get regular exercise. This is one of the most important things you can do for your health. ? Most adults should exercise for at least 150 minutes each week. The exercise should increase your heart rate and make you sweat (moderate-intensity exercise). ? Most adults should also do strengthening exercises at least twice a week. This is in addition to the moderate-intensity exercise. Maintain a healthy weight  Body mass index (BMI) is a measurement that can be used to identify possible weight problems. It estimates body fat based on height and weight. Your health care provider can help determine your BMI and help you achieve or maintain a healthy weight.  For females 71 years of age and older: ? A BMI below 18.5 is considered underweight. ? A BMI of 18.5 to 24.9 is normal. ? A BMI of 25 to 29.9 is considered overweight. ? A BMI of 30 and above is considered obese. Watch levels of cholesterol and blood lipids  You should start having your blood tested for lipids and cholesterol at 24 years of age, then have this test every 5  years.  You may need to have your cholesterol levels checked more often if: ? Your lipid or cholesterol levels are high. ? You are older than 24 years of age. ? You are at high risk for heart disease. Cancer screening Lung Cancer  Lung cancer screening is recommended for adults 31-29 years old who are at high risk for lung cancer because of a history of smoking.  A yearly low-dose CT scan of the lungs is recommended for people who: ? Currently smoke. ? Have quit within the past 15 years. ? Have at least a 30-pack-year history of smoking. A pack year is smoking an average of one pack of cigarettes a day for 1 year.  Yearly screening should continue until it has been 15 years since you quit.  Yearly screening should stop if you develop a health problem that would prevent you from having lung cancer treatment. Breast Cancer  Practice breast self-awareness. This means understanding how your breasts normally appear and feel.  It also means doing regular breast self-exams. Let your health care provider know about any changes, no matter how small.  If you are in your 20s or 30s, you should have a clinical breast exam (CBE) by a health care provider every 1-3 years as part of a regular health exam.  If you are 46 or older, have a CBE every year. Also consider having a breast X-ray (mammogram) every year.  If you have a family history of breast cancer, talk  to your health care provider about genetic screening.  If you are at high risk for breast cancer, talk to your health care provider about having an MRI and a mammogram every year.  Breast cancer gene (BRCA) assessment is recommended for women who have family members with BRCA-related cancers. BRCA-related cancers include: ? Breast. ? Ovarian. ? Tubal. ? Peritoneal cancers.  Results of the assessment will determine the need for genetic counseling and BRCA1 and BRCA2 testing. Cervical Cancer Your health care provider may recommend  that you be screened regularly for cancer of the pelvic organs (ovaries, uterus, and vagina). This screening involves a pelvic examination, including checking for microscopic changes to the surface of your cervix (Pap test). You may be encouraged to have this screening done every 3 years, beginning at age 9.  For women ages 27-65, health care providers may recommend pelvic exams and Pap testing every 3 years, or they may recommend the Pap and pelvic exam, combined with testing for human papilloma virus (HPV), every 5 years. Some types of HPV increase your risk of cervical cancer. Testing for HPV may also be done on women of any age with unclear Pap test results.  Other health care providers may not recommend any screening for nonpregnant women who are considered low risk for pelvic cancer and who do not have symptoms. Ask your health care provider if a screening pelvic exam is right for you.  If you have had past treatment for cervical cancer or a condition that could lead to cancer, you need Pap tests and screening for cancer for at least 20 years after your treatment. If Pap tests have been discontinued, your risk factors (such as having a new sexual partner) need to be reassessed to determine if screening should resume. Some women have medical problems that increase the chance of getting cervical cancer. In these cases, your health care provider may recommend more frequent screening and Pap tests. Colorectal Cancer  This type of cancer can be detected and often prevented.  Routine colorectal cancer screening usually begins at 24 years of age and continues through 24 years of age.  Your health care provider may recommend screening at an earlier age if you have risk factors for colon cancer.  Your health care provider may also recommend using home test kits to check for hidden blood in the stool.  A small camera at the end of a tube can be used to examine your colon directly (sigmoidoscopy or  colonoscopy). This is done to check for the earliest forms of colorectal cancer.  Routine screening usually begins at age 7.  Direct examination of the colon should be repeated every 5-10 years through 24 years of age. However, you may need to be screened more often if early forms of precancerous polyps or small growths are found. Skin Cancer  Check your skin from head to toe regularly.  Tell your health care provider about any new moles or changes in moles, especially if there is a change in a mole's shape or color.  Also tell your health care provider if you have a mole that is larger than the size of a pencil eraser.  Always use sunscreen. Apply sunscreen liberally and repeatedly throughout the day.  Protect yourself by wearing long sleeves, pants, a wide-brimmed hat, and sunglasses whenever you are outside. Heart disease, diabetes, and high blood pressure  High blood pressure causes heart disease and increases the risk of stroke. High blood pressure is more likely to develop in: ?  People who have blood pressure in the high end of the normal range (130-139/85-89 mm Hg). ? People who are overweight or obese. ? People who are African American.  If you are 60-77 years of age, have your blood pressure checked every 3-5 years. If you are 21 years of age or older, have your blood pressure checked every year. You should have your blood pressure measured twice-once when you are at a hospital or clinic, and once when you are not at a hospital or clinic. Record the average of the two measurements. To check your blood pressure when you are not at a hospital or clinic, you can use: ? An automated blood pressure machine at a pharmacy. ? A home blood pressure monitor.  If you are between 43 years and 84 years old, ask your health care provider if you should take aspirin to prevent strokes.  Have regular diabetes screenings. This involves taking a blood sample to check your fasting blood sugar  level. ? If you are at a normal weight and have a low risk for diabetes, have this test once every three years after 24 years of age. ? If you are overweight and have a high risk for diabetes, consider being tested at a younger age or more often. Preventing infection Hepatitis B  If you have a higher risk for hepatitis B, you should be screened for this virus. You are considered at high risk for hepatitis B if: ? You were born in a country where hepatitis B is common. Ask your health care provider which countries are considered high risk. ? Your parents were born in a high-risk country, and you have not been immunized against hepatitis B (hepatitis B vaccine). ? You have HIV or AIDS. ? You use needles to inject street drugs. ? You live with someone who has hepatitis B. ? You have had sex with someone who has hepatitis B. ? You get hemodialysis treatment. ? You take certain medicines for conditions, including cancer, organ transplantation, and autoimmune conditions. Hepatitis C  Blood testing is recommended for: ? Everyone born from [birth year] through 1966. ? Anyone with known risk factors for hepatitis C. Sexually transmitted infections (STIs)  You should be screened for sexually transmitted infections (STIs) including gonorrhea and chlamydia if: ? You are sexually active and are younger than 24 years of age. ? You are older than 24 years of age and your health care provider tells you that you are at risk for this type of infection. ? Your sexual activity has changed since you were last screened and you are at an increased risk for chlamydia or gonorrhea. Ask your health care provider if you are at risk.  If you do not have HIV, but are at risk, it may be recommended that you take a prescription medicine daily to prevent HIV infection. This is called pre-exposure prophylaxis (PrEP). You are considered at risk if: ? You are sexually active and do not regularly use condoms or know the HIV status  of your partner(s). ? You take drugs by injection. ? You are sexually active with a partner who has HIV. Talk with your health care provider about whether you are at high risk of being infected with HIV. If you choose to begin PrEP, you should first be tested for HIV. You should then be tested every 3 months for as long as you are taking PrEP. Pregnancy  If you are premenopausal and you may become pregnant, ask your health care provider about  preconception counseling.  If you may become pregnant, take 400 to 800 micrograms (mcg) of folic acid every day.  If you want to prevent pregnancy, talk to your health care provider about birth control (contraception). Osteoporosis and menopause  Osteoporosis is a disease in which the bones lose minerals and strength with aging. This can result in serious bone fractures. Your risk for osteoporosis can be identified using a bone density scan.  If you are 45 years of age or older, or if you are at risk for osteoporosis and fractures, ask your health care provider if you should be screened.  Ask your health care provider whether you should take a calcium or vitamin D supplement to lower your risk for osteoporosis.  Menopause may have certain physical symptoms and risks.  Hormone replacement therapy may reduce some of these symptoms and risks. Talk to your health care provider about whether hormone replacement therapy is right for you. Follow these instructions at home:  Schedule regular health, dental, and eye exams.  Stay current with your immunizations.  Do not use any tobacco products including cigarettes, chewing tobacco, or electronic cigarettes.  If you are pregnant, do not drink alcohol.  If you are breastfeeding, limit how much and how often you drink alcohol.  Limit alcohol intake to no more than 1 drink per day for nonpregnant women. One drink equals 12 ounces of beer, 5 ounces of wine, or 1 ounces of hard liquor.  Do not use street  drugs.  Do not share needles.  Ask your health care provider for help if you need support or information about quitting drugs.  Tell your health care provider if you often feel depressed.  Tell your health care provider if you have ever been abused or do not feel safe at home. This information is not intended to replace advice given to you by your health care provider. Make sure you discuss any questions you have with your health care provider. Document Released: 05/06/2011 Document Revised: 03/28/2016 Document Reviewed: 07/25/2015 Elsevier Interactive Patient Education  2019 [employer].

## 2018-12-03 ENCOUNTER — Encounter: Payer: Self-pay | Admitting: Nurse Practitioner

## 2018-12-10 DIAGNOSIS — Z01419 Encounter for gynecological examination (general) (routine) without abnormal findings: Secondary | ICD-10-CM | POA: Diagnosis not present

## 2018-12-10 DIAGNOSIS — R350 Frequency of micturition: Secondary | ICD-10-CM | POA: Insufficient documentation

## 2018-12-10 DIAGNOSIS — Z3042 Encounter for surveillance of injectable contraceptive: Secondary | ICD-10-CM | POA: Diagnosis not present

## 2018-12-10 DIAGNOSIS — R351 Nocturia: Secondary | ICD-10-CM | POA: Diagnosis not present

## 2018-12-10 DIAGNOSIS — Z6828 Body mass index (BMI) 28.0-28.9, adult: Secondary | ICD-10-CM | POA: Diagnosis not present

## 2019-03-04 DIAGNOSIS — Z3042 Encounter for surveillance of injectable contraceptive: Secondary | ICD-10-CM | POA: Diagnosis not present

## 2019-03-04 DIAGNOSIS — N870 Mild cervical dysplasia: Secondary | ICD-10-CM | POA: Diagnosis not present

## 2019-03-04 DIAGNOSIS — R87612 Low grade squamous intraepithelial lesion on cytologic smear of cervix (LGSIL): Secondary | ICD-10-CM | POA: Diagnosis not present

## 2019-05-25 DIAGNOSIS — Z3042 Encounter for surveillance of injectable contraceptive: Secondary | ICD-10-CM | POA: Diagnosis not present

## 2019-08-17 DIAGNOSIS — Z3042 Encounter for surveillance of injectable contraceptive: Secondary | ICD-10-CM | POA: Diagnosis not present

## 2019-09-13 ENCOUNTER — Other Ambulatory Visit: Payer: Self-pay

## 2019-09-13 DIAGNOSIS — Z20822 Contact with and (suspected) exposure to covid-19: Secondary | ICD-10-CM

## 2019-09-16 LAB — NOVEL CORONAVIRUS, NAA: SARS-CoV-2, NAA: NOT DETECTED

## 2019-11-03 DIAGNOSIS — Z3042 Encounter for surveillance of injectable contraceptive: Secondary | ICD-10-CM | POA: Diagnosis not present

## 2019-12-16 ENCOUNTER — Ambulatory Visit: Payer: BLUE CROSS/BLUE SHIELD | Attending: Internal Medicine

## 2019-12-16 DIAGNOSIS — Z20822 Contact with and (suspected) exposure to covid-19: Secondary | ICD-10-CM

## 2019-12-18 LAB — NOVEL CORONAVIRUS, NAA: SARS-CoV-2, NAA: NOT DETECTED

## 2020-01-06 DIAGNOSIS — R87612 Low grade squamous intraepithelial lesion on cytologic smear of cervix (LGSIL): Secondary | ICD-10-CM | POA: Insufficient documentation

## 2020-01-06 DIAGNOSIS — Z6831 Body mass index (BMI) 31.0-31.9, adult: Secondary | ICD-10-CM | POA: Diagnosis not present

## 2020-01-06 DIAGNOSIS — Z01419 Encounter for gynecological examination (general) (routine) without abnormal findings: Secondary | ICD-10-CM | POA: Diagnosis not present

## 2020-01-18 DIAGNOSIS — Z3042 Encounter for surveillance of injectable contraceptive: Secondary | ICD-10-CM | POA: Diagnosis not present

## 2020-02-23 ENCOUNTER — Other Ambulatory Visit: Payer: Self-pay

## 2020-02-24 ENCOUNTER — Encounter: Payer: Self-pay | Admitting: Nurse Practitioner

## 2020-02-24 ENCOUNTER — Ambulatory Visit: Payer: BC Managed Care – PPO | Admitting: Nurse Practitioner

## 2020-02-24 VITALS — BP 100/80 | HR 76 | Temp 97.9°F | Ht 64.0 in | Wt 182.6 lb

## 2020-02-24 DIAGNOSIS — M940 Chondrocostal junction syndrome [Tietze]: Secondary | ICD-10-CM

## 2020-02-24 DIAGNOSIS — F4323 Adjustment disorder with mixed anxiety and depressed mood: Secondary | ICD-10-CM

## 2020-02-24 NOTE — Patient Instructions (Signed)
Schedule appt with schedule psychologist.  Use tylenol or ibuprofen for chest wall discomfort abd cold compress   Managing Stress, Adult Feeling a certain amount of stress is normal. Stress helps our body and mind get ready to deal with the demands of life. Stress hormones can motivate you to do well at work and meet your responsibilities. However severe or long-lasting (chronic) stress can affect your mental and physical health. Chronic stress puts you at higher risk for anxiety, depression, and other health problems like digestive problems, muscle aches, heart disease, high blood pressure, and stroke. What are the causes? Common causes of stress include:  Demands from work, such as deadlines, feeling overworked, or having long hours.  Pressures at home, such as money issues, disagreements with a spouse, or parenting issues.  Pressures from major life changes, such as divorce, moving, loss of a loved one, or chronic illness. You may be at higher risk for stress-related problems if you do not get enough sleep, are in poor health, do not have emotional support, or have a mental health disorder like anxiety or depression. How to recognize stress Stress can make you:  Have trouble sleeping.  Feel sad, anxious, irritable, or overwhelmed.  Lose your appetite.  Overeat or want to eat unhealthy foods.  Want to use drugs or alcohol. Stress can also cause physical symptoms, such as:  Sore, tense muscles, especially in the shoulders and neck.  Headaches.  Trouble breathing.  A faster heart rate.  Stomach pain, nausea, or vomiting.  Diarrhea or constipation.  Trouble concentrating. Follow these instructions at home: Lifestyle  Identify the source of your stress and your reaction to it. See a therapist who can help you change your reactions.  When there are stressful events: ? Talk about it with family, friends, or co-workers. ? Try to think realistically about stressful events  and not ignore them or overreact. ? Try to find the positives in a stressful situation and not focus on the negatives. ? Cut back on responsibilities at work and home, if possible. Ask for help from friends or family members if you need it.  Find ways to cope with stress, such as: ? Meditation. ? Deep breathing. ? Yoga or tai chi. ? Progressive muscle relaxation. ? Doing art, playing music, or reading. ? Making time for fun activities. ? Spending time with family and friends.  Get support from family, friends, or spiritual resources. Eating and drinking  Eat a healthy diet. This includes: ? Eating foods that are high in fiber, such as beans, whole grains, and fresh fruits and vegetables. ? Limiting foods that are high in fat and processed sugars, such as fried and sweet foods.  Do not skip meals or overeat.  Drink enough fluid to keep your urine pale yellow. Alcohol use  Do not drink alcohol if: ? Your health care provider tells you not to drink. ? You are pregnant, may be pregnant, or are planning to become pregnant.  Drinking alcohol is a way some people try to ease their stress. This can be dangerous, so if you drink alcohol: ? Limit how much you use to:  0-1 drink a day for women.  0-2 drinks a day for men. ? Be aware of how much alcohol is in your drink. In the [place] one drink equals one 12 oz bottle of beer (355 mL), one 5 oz glass of wine (148 mL), or one 1 oz glass of hard liquor (44 mL). Activity   Include 30  minutes of exercise in your daily schedule. Exercise is a good stress reducer.  Include time in your day for an activity that you find relaxing. Try taking a walk, going on a bike ride, reading a book, or listening to music.  Schedule your time in a way that lowers stress, and keep a consistent schedule. Prioritize what is most important to get done. General instructions  Get enough sleep. Try to go to sleep and get up at about the same time every  day.  Take over-the-counter and prescription medicines only as told by your health care provider.  Do not use any products that contain nicotine or tobacco, such as cigarettes, e-cigarettes, and chewing tobacco. If you need help quitting, ask your health care provider.  Do not use drugs or smoke to cope with stress.  Keep all follow-up visits as told by your health care provider. This is important. Where to find support  Talk with your health care provider about stress management or finding a support group.  Find a therapist to work with you on your stress management techniques. Contact a health care provider if:  Your stress symptoms get worse.  You are unable to manage your stress at home.  You are struggling to stop using drugs or alcohol. Get help right away if:  You may be a danger to yourself or others.  You have any thoughts of death or suicide. If you ever feel like you may hurt yourself or others, or have thoughts about taking your own life, get help right away. You can go to your nearest emergency department or call:  Your local emergency services (911 in the [place]  A suicide crisis helpline, such as the [facility] at (262)686-5045. This is open 24 hours a day. Summary  Feeling a certain amount of stress is normal, but severe or long-lasting (chronic) stress can affect your mental and physical health.  Chronic stress can put you at higher risk for anxiety, depression, and other health problems like digestive problems, muscle aches, heart disease, high blood pressure, and stroke.  You may be at higher risk for stress-related problems if you do not get enough sleep, are in poor health, lack emotional support, or have a mental health disorder like anxiety or depression.  Identify the source of your stress and your reaction to it. Try talking about stressful events with family, friends, or co-workers, finding a coping method, or getting support  from spiritual resources.  If you need more help, talk with your health care provider about finding a support group or a mental health therapist. This information is not intended to replace advice given to you by your health care provider. Make sure you discuss any questions you have with your health care provider. Document Revised: 05/19/2019 Document Reviewed: 05/19/2019 Elsevier Patient Education  [place].    Costochondritis Costochondritis is swelling and irritation (inflammation) of the tissue (cartilage) that connects your ribs to your breastbone (sternum). This causes pain in the front of your chest. Usually, the pain:  Starts gradually.  Is in more than one rib. This condition usually goes away on its own over time. Follow these instructions at home:  Do not do anything that makes your pain worse.  If directed, put ice on the painful area: ? Put ice in a plastic bag. ? Place a towel between your skin and the bag. ? Leave the ice on for 20 minutes, 2-3 times a day.  If directed, put heat  on the affected area as often as told by your doctor. Use the heat source that your doctor tells you to use, such as a moist heat pack or a heating pad. ? Place a towel between your skin and the heat source. ? Leave the heat on for 20-30 minutes. ? Take off the heat if your skin turns bright red. This is very important if you cannot feel pain, heat, or cold. You may have a greater risk of getting burned.  Take over-the-counter and prescription medicines only as told by your doctor.  Return to your normal activities as told by your doctor. Ask your doctor what activities are safe for you.  Keep all follow-up visits as told by your doctor. This is important. Contact a doctor if:  You have chills or a fever.  Your pain does not go away or it gets worse.  You have a cough that does not go away. Get help right away if:  You are short of breath. This information is not intended  to replace advice given to you by your health care provider. Make sure you discuss any questions you have with your health care provider. Document Revised: 11/05/2017 Document Reviewed: 02/14/2016 Elsevier Patient Education  2020 [employer].

## 2020-02-24 NOTE — Assessment & Plan Note (Addendum)
Onset 54months ago, worsening, triggered by increased work responsibilities. Does not was to take medication at this time. Plans to schedule appt with psychologist through [facility].  F/up in 12months

## 2020-02-24 NOTE — Progress Notes (Signed)
Subjective:  Patient ID: Denise Galvan, female    DOB: Nov 22, 1995  Age: 24 y.o. MRN: [id number]  CC: Establish Care (est care/anxiety and depression consult--going on for 3 month now--more from work/ sharp pain at times near heart area,has to sit still to releived--hard to move when that occur--going on for 6 mo. )  Anxiety Presents for initial visit. Onset was 1 to 6 months ago. The problem has been waxing and waning. Symptoms include decreased concentration, excessive worry, irritability, muscle tension, nervous/anxious behavior, panic and restlessness. Patient reports no chest pain, dizziness, dry mouth, shortness of breath or suicidal ideas. Symptoms occur constantly. The severity of symptoms is causing significant distress. The symptoms are aggravated by work stress. The quality of sleep is fair.   There is no history of anxiety/panic attacks, bipolar disorder, depression, hyperthyroidism or suicide attempts. Past treatments include nothing.  plans to schedule appt with psychologist through [facility].  She reports chest wall discomfort, intermittent, onset 39months ago, described as sharp, worse with deep breaths and arm movement, last for 5-37mins and resolves spontaneously, occurs once a month. denies any GERD or palpitation or rash or palpitation or SOB or cough.  Social History   Socioeconomic History  . Marital status: Single    Spouse name: Not on file  . Number of children: 0  . Years of education: Not on file  . Highest education level: Not on file  Occupational History  . Not on file  Tobacco Use  . Smoking status: Passive Smoke Exposure - Never Smoker  . Smokeless tobacco: Never Used  Substance and Sexual Activity  . Alcohol use: Yes    Alcohol/week: 0.0 standard drinks    Comment: social  . Drug use: No  . Sexual activity: Yes    Birth control/protection: I.U.D.    Comment: Mother smokes  Other Topics Concern  . Not on file  Social History Narrative   [place]  graduated from [employer] in 2015. 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   Lives with her parents and sibling.   Social Determinants of Health   Financial Resource Strain:   . Difficulty of Paying Living Expenses:   Food Insecurity:   . Worried About Programme researcher, broadcasting/film/video in the Last Year:   . Barista in the Last Year:   Transportation Needs:   . Freight forwarder (Medical):   Marland Kitchen Lack of Transportation (Non-Medical):   Physical Activity: Inactive  . Days of Exercise per Week: 0 days  . Minutes of Exercise per Session: 0 min  Stress:   . Feeling of Stress :   Social Connections: Unknown  . Frequency of Communication with Friends and Family: More than three times a week  . Frequency of Social Gatherings with Friends and Family: More than three times a week  . Attends Religious Services: Not on file  . Active Member of Clubs or Organizations: Not on file  . Attends Banker Meetings: Not on file  . Marital Status: Never married  Intimate Partner Violence: Not At Risk  . Fear of Current or Ex-Partner: No  . Emotionally Abused: No  . Physically Abused: No  . Sexually Abused: No   Reviewed past Medical, Social and Family history today.  Outpatient Medications Prior to Visit  Medication Sig Dispense Refill  . ibuprofen (ADVIL,MOTRIN) 200 MG tablet Take 200 mg by mouth every 6 (six)  hours as needed.    . medroxyPROGESTERone Acetate (DEPO-PROVERA IM) Inject into the muscle.     No facility-administered medications prior to visit.    ROS See HPI  Objective:  BP 100/80   Pulse 76   Temp 97.9 F (36.6 C) (Tympanic)   Ht 5\' 4"  (1.626 m)   Wt 182 lb 9.6 oz (82.8 kg)   SpO2 99%   BMI 31.34 kg/m   BP Readings from Last 3 Encounters:  02/24/20 100/80  12/02/18 112/88  11/11/18 120/86    Wt Readings from Last 3 Encounters:  02/24/20 182 lb 9.6 oz (82.8 kg)   12/02/18 164 lb (74.4 kg)  11/11/18 164 lb (74.4 kg)    Physical Exam Neck:     Thyroid: No thyroid mass, thyromegaly or thyroid tenderness.  Cardiovascular:     Rate and Rhythm: Normal rate and regular rhythm.     Pulses: Normal pulses.     Heart sounds: Normal heart sounds.  Pulmonary:     Effort: Pulmonary effort is normal.     Breath sounds: Normal breath sounds.  Chest:     Chest wall: No tenderness.  Abdominal:     Palpations: Abdomen is soft.  Musculoskeletal:     Cervical back: Normal range of motion and neck supple.     Right lower leg: No edema.     Left lower leg: No edema.  Lymphadenopathy:     Cervical: No cervical adenopathy.     Right cervical: No superficial, deep or posterior cervical adenopathy.    Left cervical: No superficial, deep or posterior cervical adenopathy.  Skin:    Findings: No rash.  Neurological:     Mental Status: She is alert and oriented to person, place, and time.  Psychiatric:        Mood and Affect: Mood normal.        Behavior: Behavior normal.        Thought Content: Thought content normal.     Lab Results  Component Value Date   WBC 8.2 12/02/2018   HGB 14.6 12/02/2018   HCT 42.9 12/02/2018   PLT 466.0 (H) 12/02/2018   GLUCOSE 99 12/02/2018   CHOL 189 12/02/2018   TRIG 92.0 12/02/2018   HDL 37.40 (L) 12/02/2018   LDLCALC 133 (H) 12/02/2018   ALT 13 12/02/2018   AST 13 12/02/2018   NA 138 12/02/2018   K 4.8 12/02/2018   CL 105 12/02/2018   CREATININE 0.80 12/02/2018   BUN 12 12/02/2018   CO2 25 12/02/2018    Assessment & Plan:  This visit occurred during the SARS-CoV-2 public health emergency.  Safety protocols were in place, including screening questions prior to the visit, additional usage of staff PPE, and extensive cleaning of exam room while observing appropriate contact time as indicated for disinfecting solutions.   Denise Galvan was seen today for establish care.  Diagnoses and all orders for this  visit:  Adjustment disorder with mixed anxiety and depressed mood  Costochondritis, acute  Schedule appt with schedule psychologist. Use tylenol or ibuprofen for chest wall discomfort abd cold compress Re eval mood in 49months  I am having Denise Galvan maintain her ibuprofen and medroxyPROGESTERone Acetate (DEPO-PROVERA IM).  No orders of the defined types were placed in this encounter.   Problem List Items Addressed This Visit      Other   Adjustment disorder with mixed anxiety and depressed mood - Primary    Onset 50months ago, worsening, triggered by  increased work Paramedic. Does not was to take medication at this time. Plans to schedule appt with psychologist through [facility].  F/up in 1months         Other Visit Diagnoses    Costochondritis, acute           Follow-up: Return in about 2 weeks (around 03/09/2020) for CPE (fasting).  Wilfred Lacy, NP

## 2020-03-08 ENCOUNTER — Other Ambulatory Visit: Payer: Self-pay

## 2020-03-09 ENCOUNTER — Ambulatory Visit (INDEPENDENT_AMBULATORY_CARE_PROVIDER_SITE_OTHER): Payer: BC Managed Care – PPO | Admitting: Nurse Practitioner

## 2020-03-09 ENCOUNTER — Encounter: Payer: Self-pay | Admitting: Nurse Practitioner

## 2020-03-09 ENCOUNTER — Encounter: Payer: BC Managed Care – PPO | Admitting: Nurse Practitioner

## 2020-03-09 VITALS — BP 110/82 | HR 75 | Temp 97.3°F | Ht 64.0 in | Wt 181.2 lb

## 2020-03-09 DIAGNOSIS — Z0000 Encounter for general adult medical examination without abnormal findings: Secondary | ICD-10-CM | POA: Diagnosis not present

## 2020-03-09 DIAGNOSIS — Z136 Encounter for screening for cardiovascular disorders: Secondary | ICD-10-CM

## 2020-03-09 DIAGNOSIS — Z13220 Encounter for screening for lipoid disorders: Secondary | ICD-10-CM | POA: Diagnosis not present

## 2020-03-09 DIAGNOSIS — E669 Obesity, unspecified: Secondary | ICD-10-CM | POA: Diagnosis not present

## 2020-03-09 LAB — COMPREHENSIVE METABOLIC PANEL
ALT: 15 U/L (ref 0–35)
AST: 18 U/L (ref 0–37)
Albumin: 4.3 g/dL (ref 3.5–5.2)
Alkaline Phosphatase: 64 U/L (ref 39–117)
BUN: 9 mg/dL (ref 6–23)
CO2: 28 mEq/L (ref 19–32)
Calcium: 9.7 mg/dL (ref 8.4–10.5)
Chloride: 104 mEq/L (ref 96–112)
Creatinine, Ser: 0.78 mg/dL (ref 0.40–1.20)
GFR: 90.23 mL/min (ref >60.00)
Glucose, Bld: 81 mg/dL (ref 70–99)
Potassium: 4 mEq/L (ref 3.5–5.1)
Sodium: 137 mEq/L (ref 135–145)
Total Bilirubin: 0.5 mg/dL (ref 0.2–1.2)
Total Protein: 7.7 g/dL (ref 6.0–8.3)

## 2020-03-09 LAB — LIPID PANEL
Cholesterol: 212 mg/dL — ABNORMAL HIGH (ref 0–200)
HDL: 39.1 mg/dL (ref >39.00)
LDL Cholesterol: 150 mg/dL — ABNORMAL HIGH (ref 0–99)
NonHDL: 173.24
Total CHOL/HDL Ratio: 5
Triglycerides: 118 mg/dL (ref 0.0–149.0)
VLDL: 23.6 mg/dL (ref 0.0–40.0)

## 2020-03-09 LAB — CBC
HCT: 40.8 % (ref 36.0–46.0)
Hemoglobin: 14.2 g/dL (ref 12.0–15.0)
MCHC: 34.8 g/dL (ref 30.0–36.0)
MCV: 89.3 fl (ref 78.0–100.0)
Platelets: 453 10*3/uL — ABNORMAL HIGH (ref 150.0–400.0)
RBC: 4.57 Mil/uL (ref 3.87–5.11)
RDW: 12.5 % (ref 11.5–15.5)
WBC: 12 10*3/uL — ABNORMAL HIGH (ref 4.0–10.5)

## 2020-03-09 LAB — TSH: TSH: 1.31 u[IU]/mL (ref 0.35–4.50)

## 2020-03-09 NOTE — Patient Instructions (Signed)
Go to lab for blood draw   Health Maintenance, Female Adopting a healthy lifestyle and getting preventive care are important in promoting health and wellness. Ask your health care provider about:  The right schedule for you to have regular tests and exams.  Things you can do on your own to prevent diseases and keep yourself healthy. What should I know about diet, weight, and exercise? Eat a healthy diet   Eat a diet that includes plenty of vegetables, fruits, low-fat dairy products, and lean protein.  Do not eat a lot of foods that are high in solid fats, added sugars, or sodium. Maintain a healthy weight Body mass index (BMI) is used to identify weight problems. It estimates body fat based on height and weight. Your health care provider can help determine your BMI and help you achieve or maintain a healthy weight. Get regular exercise Get regular exercise. This is one of the most important things you can do for your health. Most adults should:  Exercise for at least 150 minutes each week. The exercise should increase your heart rate and make you sweat (moderate-intensity exercise).  Do strengthening exercises at least twice a week. This is in addition to the moderate-intensity exercise.  Spend less time sitting. Even light physical activity can be beneficial. Watch cholesterol and blood lipids Have your blood tested for lipids and cholesterol at 25 years of age, then have this test every 5 years. Have your cholesterol levels checked more often if:  Your lipid or cholesterol levels are high.  You are older than 25 years of age.  You are at high risk for heart disease. What should I know about cancer screening? Depending on your health history and family history, you may need to have cancer screening at various ages. This may include screening for:  Breast cancer.  Cervical cancer.  Colorectal cancer.  Skin cancer.  Lung cancer. What should I know about heart disease,  diabetes, and high blood pressure? Blood pressure and heart disease  High blood pressure causes heart disease and increases the risk of stroke. This is more likely to develop in people who have high blood pressure readings, are of African descent, or are overweight.  Have your blood pressure checked: ? Every 3-5 years if you are 18-39 years of age. ? Every year if you are 40 years old or older. Diabetes Have regular diabetes screenings. This checks your fasting blood sugar level. Have the screening done:  Once every three years after age 40 if you are at a normal weight and have a low risk for diabetes.  More often and at a younger age if you are overweight or have a high risk for diabetes. What should I know about preventing infection? Hepatitis B If you have a higher risk for hepatitis B, you should be screened for this virus. Talk with your health care provider to find out if you are at risk for hepatitis B infection. Hepatitis C Testing is recommended for:  Everyone born from 1945 through 1965.  Anyone with known risk factors for hepatitis C. Sexually transmitted infections (STIs)  Get screened for STIs, including gonorrhea and chlamydia, if: ? You are sexually active and are younger than 24 years of age. ? You are older than 24 years of age and your health care provider tells you that you are at risk for this type of infection. ? Your sexual activity has changed since you were last screened, and you are at increased risk for chlamydia   or gonorrhea. Ask your health care provider if you are at risk.  Ask your health care provider about whether you are at high risk for HIV. Your health care provider may recommend a prescription medicine to help prevent HIV infection. If you choose to take medicine to prevent HIV, you should first get tested for HIV. You should then be tested every 3 months for as long as you are taking the medicine. Pregnancy  If you are about to stop having your  period (premenopausal) and you may become pregnant, seek counseling before you get pregnant.  Take 400 to 800 micrograms (mcg) of folic acid every day if you become pregnant.  Ask for birth control (contraception) if you want to prevent pregnancy. Osteoporosis and menopause Osteoporosis is a disease in which the bones lose minerals and strength with aging. This can result in bone fractures. If you are 65 years old or older, or if you are at risk for osteoporosis and fractures, ask your health care provider if you should:  Be screened for bone loss.  Take a calcium or vitamin D supplement to lower your risk of fractures.  Be given hormone replacement therapy (HRT) to treat symptoms of menopause. Follow these instructions at home: Lifestyle  Do not use any products that contain nicotine or tobacco, such as cigarettes, e-cigarettes, and chewing tobacco. If you need help quitting, ask your health care provider.  Do not use street drugs.  Do not share needles.  Ask your health care provider for help if you need support or information about quitting drugs. Alcohol use  Do not drink alcohol if: ? Your health care provider tells you not to drink. ? You are pregnant, may be pregnant, or are planning to become pregnant.  If you drink alcohol: ? Limit how much you use to 0-1 drink a day. ? Limit intake if you are breastfeeding.  Be aware of how much alcohol is in your drink. In the [place] one drink equals one 12 oz bottle of beer (355 mL), one 5 oz glass of wine (148 mL), or one 1 oz glass of hard liquor (44 mL). General instructions  Schedule regular health, dental, and eye exams.  Stay current with your vaccines.  Tell your health care provider if: ? You often feel depressed. ? You have ever been abused or do not feel safe at home. Summary  Adopting a healthy lifestyle and getting preventive care are important in promoting health and wellness.  Follow your health care provider's  instructions about healthy diet, exercising, and getting tested or screened for diseases.  Follow your health care provider's instructions on monitoring your cholesterol and blood pressure. This information is not intended to replace advice given to you by your health care provider. Make sure you discuss any questions you have with your health care provider. Document Revised: 10/14/2018 Document Reviewed: 10/14/2018 Elsevier Patient Education  2020 [employer]  

## 2020-03-09 NOTE — Progress Notes (Signed)
Subjective:    Patient ID: Denise Galvan, female    DOB: 1996/10/06, 24 y.o.   MRN: [id number]  Patient presents today for complete physical  HPI  Sexual History (orientation,birth control, marital status, STD):sexually active, use of Depo injection, PAP and brest exam done by GYN  Depression/Suicide: improving with medication, upcoming appt with therapist via EAP. Depression screen [facility] 2/9 03/09/2020 03/09/2020 02/24/2020 05/28/2018  Decreased Interest 1 0 2 0  Down, Depressed, Hopeless 1 0 2 0  PHQ - 2 Score 2 0 4 0  Altered sleeping 3 - 3 -  Tired, decreased energy 2 - 2 -  Change in appetite 1 - 3 -  Feeling bad or failure about yourself  1 - 2 -  Trouble concentrating 1 - 1 -  Moving slowly or fidgety/restless 0 - 3 -  Suicidal thoughts 0 - 0 -  PHQ-9 Score 10 - 18 -  Difficult doing work/chores - - Very difficult -   Vision:not needed per patient  Dental:will schedule  Immunizations: (TDAP, Hep C screen, Pneumovax, Influenza, zoster)  Health Maintenance  Topic Date Due  . HIV Screening  03/09/2021*  . Flu Shot  06/04/2020  . PAP-Cervical Cytology Screening  10/23/2020  . Pap Smear  10/23/2020  . Tetanus Vaccine  12/02/2028  . COVID-19 Vaccine  Completed  *Topic was postponed. The date shown is not the original due date.   Diet:regular.  Weight:  Wt Readings from Last 3 Encounters:  03/09/20 181 lb 3.2 oz (82.2 kg)  02/24/20 182 lb 9.6 oz (82.8 kg)  12/02/18 164 lb (74.4 kg)    Exercise:none  Fall Risk: Fall Risk  03/09/2020 02/24/2020 05/28/2018  Falls in the past year? 0 0 No  Number falls in past yr: 0 - -  Injury with Fall? 0 - -   Medications and allergies reviewed with patient and updated if appropriate.  Patient Active Problem List   Diagnosis Date Noted  . Adjustment disorder with mixed anxiety and depressed mood 02/24/2020  . LGSIL on Pap smear of cervix 01/06/2020  . Increased frequency of urination 12/10/2018  . Routine general medical  examination at a health care facility 12/02/2018  . Anxiety 05/28/2018  . Low back pain 05/28/2018  . Acne 05/28/2018  . Migraine with aura 11/01/2013  . Episodic tension type headache 11/01/2013  . ADHD (attention deficit hyperactivity disorder) 07/25/1999    Current Outpatient Medications on File Prior to Visit  Medication Sig Dispense Refill  . ibuprofen (ADVIL,MOTRIN) 200 MG tablet Take 200 mg by mouth every 6 (six) hours as needed.    . medroxyPROGESTERone Acetate (DEPO-PROVERA IM) Inject into the muscle.     No current facility-administered medications on file prior to visit.    Past Medical History:  Diagnosis Date  . Headache(784.0)     History reviewed. No pertinent surgical history.  Social History   Socioeconomic History  . Marital status: Single    Spouse name: Not on file  . Number of children: 0  . Years of education: Not on file  . Highest education level: Not on file  Occupational History  . Not on file  Tobacco Use  . Smoking status: Passive Smoke Exposure - Never Smoker  . Smokeless tobacco: Never Used  Substance and Sexual Activity  . Alcohol use: Yes    Alcohol/week: 0.0 standard drinks    Comment: social  . Drug use: No  . Sexual activity: Yes    Birth control/protection: I.U.D.  Comment: Mother smokes  Other Topics Concern  . Not on file  Social History Narrative   Denise Galvan graduated from [employer] in 2015. 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   Lives with her parents and sibling.   Social Determinants of Health   Financial Resource Strain:   . Difficulty of Paying Living Expenses:   Food Insecurity:   . Worried About Programme researcher, broadcasting/film/video in the Last Year:   . Barista in the Last Year:   Transportation Needs:   . Freight forwarder (Medical):   Marland Kitchen Lack of Transportation (Non-Medical):   Physical Activity: Inactive  . Days of Exercise  per Week: 0 days  . Minutes of Exercise per Session: 0 min  Stress:   . Feeling of Stress :   Social Connections: Unknown  . Frequency of Communication with Friends and Family: More than three times a week  . Frequency of Social Gatherings with Friends and Family: More than three times a week  . Attends Religious Services: Not on file  . Active Member of Clubs or Organizations: Not on file  . Attends Banker Meetings: Not on file  . Marital Status: Never married    Family History  Problem Relation Age of Onset  . Hyperlipidemia Father   . Migraines Father   . Other Father        Dyslipidemia  . Heart disease Maternal Grandmother   . Migraines Maternal Grandmother   . Diabetes Maternal Grandmother   . Hyperlipidemia Paternal Grandmother   . Migraines Mother        Onset between the age of 5 or 49  . Anxiety disorder Sister   . Bladder Cancer Maternal Grandfather        Died at 30  . Heart attack Paternal Grandfather        Died at 52  . Other Paternal Grandfather        Staph infection  . Stroke Paternal Grandfather   . Migraines Maternal Aunt         Review of Systems  Constitutional: Negative for fever, malaise/fatigue and weight loss.  HENT: Negative for congestion and sore throat.   Eyes:       Negative for visual changes  Respiratory: Negative for cough and shortness of breath.   Cardiovascular: Negative for chest pain, palpitations and leg swelling.  Gastrointestinal: Negative for blood in stool, constipation, diarrhea and heartburn.  Genitourinary: Negative for dysuria, frequency and urgency.  Musculoskeletal: Negative for falls, joint pain and myalgias.  Skin: Negative for rash.  Neurological: Negative for dizziness, sensory change and headaches.  Endo/Heme/Allergies: Does not bruise/bleed easily.  Psychiatric/Behavioral: Negative for depression, substance abuse and suicidal ideas. The patient is not nervous/anxious.     Objective:    Vitals:   03/09/20 1326  BP: 110/82  Pulse: 75  Temp: (!) 97.3 F (36.3 C)  SpO2: 97%    Body mass index is 31.1 kg/m.   Physical Examination:  Physical Exam Vitals reviewed.  Constitutional:      General: She is not in acute distress.    Appearance: She is well-developed. She is obese.  HENT:     Right Ear: Tympanic membrane, ear canal and external ear normal.     Left Ear: Tympanic membrane, ear canal and external ear normal.  Eyes:     Extraocular Movements: Extraocular movements intact.  Conjunctiva/sclera: Conjunctivae normal.  Cardiovascular:     Rate and Rhythm: Normal rate and regular rhythm.     Heart sounds: Normal heart sounds.  Pulmonary:     Effort: Pulmonary effort is normal. No respiratory distress.     Breath sounds: Normal breath sounds.  Chest:     Chest wall: No tenderness.  Abdominal:     General: Bowel sounds are normal.     Palpations: Abdomen is soft.  Genitourinary:    Comments: Breast and pelvic exam deferred to GYN by patient Musculoskeletal:        General: Normal range of motion.     Cervical back: Normal range of motion and neck supple.     Right lower leg: No edema.     Left lower leg: No edema.  Lymphadenopathy:     Cervical: No cervical adenopathy.  Skin:    General: Skin is warm and dry.  Neurological:     Mental Status: She is alert and oriented to person, place, and time.     Deep Tendon Reflexes: Reflexes are normal and symmetric.  Psychiatric:        Mood and Affect: Mood normal.        Behavior: Behavior normal.        Thought Content: Thought content normal.     ASSESSMENT and PLAN: This visit occurred during the SARS-CoV-2 public health emergency.  Safety protocols were in place, including screening questions prior to the visit, additional usage of staff PPE, and extensive cleaning of exam room while observing appropriate contact time as indicated for disinfecting solutions.   Danitra was seen today for annual  exam.  Diagnoses and all orders for this visit:  Preventative health care -     CBC -     Comprehensive metabolic panel -     TSH -     Lipid panel  Encounter for lipid screening for cardiovascular disease -     Lipid panel    No problem-specific Assessment & Plan notes found for this encounter.      Problem List Items Addressed This Visit    None    Visit Diagnoses    Preventative health care    -  Primary   Relevant Orders   CBC   Comprehensive metabolic panel   TSH   Lipid panel   Encounter for lipid screening for cardiovascular disease       Relevant Orders   Lipid panel       Follow up: Return if symptoms worsen or fail to improve.  Wilfred Lacy, NP

## 2020-03-10 ENCOUNTER — Encounter: Payer: Self-pay | Admitting: Nurse Practitioner

## 2020-03-21 ENCOUNTER — Encounter: Payer: Self-pay | Admitting: Nurse Practitioner

## 2020-03-21 DIAGNOSIS — F4323 Adjustment disorder with mixed anxiety and depressed mood: Secondary | ICD-10-CM

## 2020-03-22 MED ORDER — ESCITALOPRAM OXALATE 10 MG PO TABS: 10.0000 mg | ORAL_TABLET | Freq: Every day | ORAL | 5 refills | Status: DC

## 2020-04-05 DIAGNOSIS — Z3042 Encounter for surveillance of injectable contraceptive: Secondary | ICD-10-CM | POA: Diagnosis not present

## 2020-04-06 ENCOUNTER — Encounter: Payer: Self-pay | Admitting: Nurse Practitioner

## 2020-04-07 ENCOUNTER — Encounter: Payer: Self-pay | Admitting: Nurse Practitioner

## 2020-04-07 DIAGNOSIS — F4323 Adjustment disorder with mixed anxiety and depressed mood: Secondary | ICD-10-CM

## 2020-04-07 MED ORDER — VENLAFAXINE HCL ER 37.5 MG PO CP24: 37.5000 mg | ORAL_CAPSULE | Freq: Every day | ORAL | 5 refills | Status: DC

## 2020-06-27 DIAGNOSIS — Z3042 Encounter for surveillance of injectable contraceptive: Secondary | ICD-10-CM | POA: Diagnosis not present

## 2020-09-10 ENCOUNTER — Emergency Department (HOSPITAL_BASED_OUTPATIENT_CLINIC_OR_DEPARTMENT_OTHER): Payer: BC Managed Care – PPO

## 2020-09-10 ENCOUNTER — Emergency Department (HOSPITAL_BASED_OUTPATIENT_CLINIC_OR_DEPARTMENT_OTHER)
Admission: EM | Admit: 2020-09-10 | Discharge: 2020-09-10 | Disposition: A | Discharge status: Home / Self Care | Payer: BC Managed Care – PPO | Attending: Emergency Medicine | Admitting: Emergency Medicine

## 2020-09-10 ENCOUNTER — Encounter (HOSPITAL_BASED_OUTPATIENT_CLINIC_OR_DEPARTMENT_OTHER): Payer: Self-pay | Admitting: Emergency Medicine

## 2020-09-10 ENCOUNTER — Other Ambulatory Visit: Payer: Self-pay

## 2020-09-10 DIAGNOSIS — Z7722 Contact with and (suspected) exposure to environmental tobacco smoke (acute) (chronic): Secondary | ICD-10-CM | POA: Insufficient documentation

## 2020-09-10 DIAGNOSIS — Z20822 Contact with and (suspected) exposure to covid-19: Secondary | ICD-10-CM | POA: Insufficient documentation

## 2020-09-10 DIAGNOSIS — J3489 Other specified disorders of nose and nasal sinuses: Secondary | ICD-10-CM | POA: Diagnosis not present

## 2020-09-10 DIAGNOSIS — R112 Nausea with vomiting, unspecified: Secondary | ICD-10-CM | POA: Diagnosis not present

## 2020-09-10 DIAGNOSIS — R000 Tachycardia, unspecified: Secondary | ICD-10-CM | POA: Diagnosis not present

## 2020-09-10 DIAGNOSIS — R509 Fever, unspecified: Secondary | ICD-10-CM | POA: Insufficient documentation

## 2020-09-10 DIAGNOSIS — R519 Headache, unspecified: Secondary | ICD-10-CM | POA: Insufficient documentation

## 2020-09-10 DIAGNOSIS — M791 Myalgia, unspecified site: Secondary | ICD-10-CM | POA: Insufficient documentation

## 2020-09-10 LAB — PREGNANCY, URINE: Preg Test, Ur: NEGATIVE

## 2020-09-10 LAB — CBC WITH DIFFERENTIAL/PLATELET
Abs Immature Granulocytes: 0.07 10*3/uL (ref 0.00–0.07)
Basophils Absolute: 0 10*3/uL (ref 0.0–0.1)
Basophils Relative: 0 %
Eosinophils Absolute: 0 10*3/uL (ref 0.0–0.5)
Eosinophils Relative: 0 %
HCT: 40 % (ref 36.0–46.0)
Hemoglobin: 14.1 g/dL (ref 12.0–15.0)
Immature Granulocytes: 1 %
Lymphocytes Relative: 7 %
Lymphs Abs: 0.7 10*3/uL (ref 0.7–4.0)
MCH: 30.8 pg (ref 26.0–34.0)
MCHC: 35.3 g/dL (ref 30.0–36.0)
MCV: 87.3 fL (ref 80.0–100.0)
Monocytes Absolute: 0.5 10*3/uL (ref 0.1–1.0)
Monocytes Relative: 5 %
Neutro Abs: 8.6 10*3/uL — ABNORMAL HIGH (ref 1.7–7.7)
Neutrophils Relative %: 87 %
Platelets: 336 10*3/uL (ref 150–400)
RBC: 4.58 MIL/uL (ref 3.87–5.11)
RDW: 11.7 % (ref 11.5–15.5)
WBC: 9.9 10*3/uL (ref 4.0–10.5)
nRBC: 0 % (ref 0.0–0.2)

## 2020-09-10 LAB — COMPREHENSIVE METABOLIC PANEL
ALT: 22 U/L (ref 0–44)
AST: 24 U/L (ref 15–41)
Albumin: 3.7 g/dL (ref 3.5–5.0)
Alkaline Phosphatase: 59 U/L (ref 38–126)
Anion gap: 10 (ref 5–15)
BUN: 8 mg/dL (ref 6–20)
CO2: 19 mmol/L — ABNORMAL LOW (ref 22–32)
Calcium: 8.9 mg/dL (ref 8.9–10.3)
Chloride: 107 mmol/L (ref 98–111)
Creatinine, Ser: 0.79 mg/dL (ref 0.44–1.00)
GFR, Estimated: >60 mL/min (ref >60)
Glucose, Bld: 89 mg/dL (ref 70–99)
Potassium: 3.8 mmol/L (ref 3.5–5.1)
Sodium: 136 mmol/L (ref 135–145)
Total Bilirubin: 0.5 mg/dL (ref 0.3–1.2)
Total Protein: 6.9 g/dL (ref 6.5–8.1)

## 2020-09-10 LAB — URINALYSIS, ROUTINE W REFLEX MICROSCOPIC
Bilirubin Urine: NEGATIVE
Glucose, UA: NEGATIVE mg/dL
Hgb urine dipstick: NEGATIVE
Ketones, ur: 40 mg/dL — AB
Leukocytes,Ua: NEGATIVE
Nitrite: NEGATIVE
Protein, ur: NEGATIVE mg/dL
Specific Gravity, Urine: 1.025 (ref 1.005–1.030)
pH: 6 (ref 5.0–8.0)

## 2020-09-10 LAB — RESPIRATORY PANEL BY RT PCR (FLU A&B, COVID)
Influenza A by PCR: NEGATIVE
Influenza B by PCR: NEGATIVE
SARS Coronavirus 2 by RT PCR: NEGATIVE

## 2020-09-10 LAB — LACTIC ACID, PLASMA: Lactic Acid, Venous: 0.8 mmol/L (ref 0.5–1.9)

## 2020-09-10 MED ORDER — ONDANSETRON HCL 4 MG/2ML IJ SOLN
4.0000 mg | Freq: Once | INTRAMUSCULAR | Status: AC
  Administered 2020-09-10: 4 mg via INTRAVENOUS
  Filled 2020-09-10: qty 2

## 2020-09-10 MED ORDER — SODIUM CHLORIDE 0.9 % IV BOLUS
1000.0000 mL | Freq: Once | INTRAVENOUS | Status: AC
  Administered 2020-09-10: 1000 mL via INTRAVENOUS

## 2020-09-10 MED ORDER — PROCHLORPERAZINE EDISYLATE 10 MG/2ML IJ SOLN
10.0000 mg | Freq: Once | INTRAMUSCULAR | Status: AC
  Administered 2020-09-10: 10 mg via INTRAVENOUS
  Filled 2020-09-10: qty 2

## 2020-09-10 MED ORDER — SODIUM CHLORIDE 0.9 % IV BOLUS
500.0000 mL | Freq: Once | INTRAVENOUS | Status: AC
  Administered 2020-09-10: 500 mL via INTRAVENOUS

## 2020-09-10 MED ORDER — DEXAMETHASONE SODIUM PHOSPHATE 10 MG/ML IJ SOLN
10.0000 mg | Freq: Once | INTRAMUSCULAR | Status: AC
  Administered 2020-09-10: 10 mg via INTRAVENOUS
  Filled 2020-09-10: qty 1

## 2020-09-10 MED ORDER — KETOROLAC TROMETHAMINE 15 MG/ML IJ SOLN
15.0000 mg | Freq: Once | INTRAMUSCULAR | Status: AC
  Administered 2020-09-10: 15 mg via INTRAVENOUS
  Filled 2020-09-10: qty 1

## 2020-09-10 MED ORDER — ACETAMINOPHEN 500 MG PO TABS
1000.0000 mg | ORAL_TABLET | Freq: Once | ORAL | Status: AC
  Administered 2020-09-10: 1000 mg via ORAL
  Filled 2020-09-10: qty 2

## 2020-09-10 MED ORDER — DIPHENHYDRAMINE HCL 50 MG/ML IJ SOLN
25.0000 mg | Freq: Once | INTRAMUSCULAR | Status: AC
  Administered 2020-09-10: 25 mg via INTRAVENOUS
  Filled 2020-09-10: qty 1

## 2020-09-10 NOTE — Discharge Instructions (Addendum)
If you develop any new or worsening symptoms please return to the emergency department for further evaluation.  It was a pleasure to meet you.

## 2020-09-10 NOTE — ED Triage Notes (Signed)
Pt arrives pov with c/o fever, N/V, generalized body aches and HA x 3 days. Pt reports being vaccinated for Covid. Ibuprofen 800mg  at 11 today

## 2020-09-10 NOTE — ED Provider Notes (Signed)
[facility] EMERGENCY DEPARTMENT Provider Note   CSN: [id number] Arrival date & time: 09/10/20  1317     History Chief Complaint  Patient presents with  . Fever    Denise Galvan is a 24 y.o. female.  HPI   Patient is a 24 year old female with a medical history as noted below.  Patient states about 2 days ago she began experiencing an intermittent fever, body aches, rhinorrhea, nausea, diffuse intermittent headache, 1 episode of vomiting that occurred just prior to arrival.  States her headache is diffuse.  Worsens when her fever also worsens and alleviates when her fever alleviates.  She took a dose of ibuprofen about 3 h ago.  Patient is currently afebrile.  She has been vaccinated for COVID-19.  Denies ear pain, sore throat, chest pain, shortness of breath, abdominal pain, urinary changes, leg swelling.      Past Medical History:  Diagnosis Date  . [id number](478.2Headache(784.0)     Patient Active Problem List   Diagnosis Date Noted  . Adjustment disorder with mixed anxiety and depressed mood 02/24/2020  . LGSIL on Pap smear of cervix 01/06/2020  . Increased frequency of urination 12/10/2018  . Routine general medical examination at a health care facility 12/02/2018  . Anxiety 05/28/2018  . Low back pain 05/28/2018  . Acne 05/28/2018  . Migraine with aura 11/01/2013  . Episodic tension type headache 11/01/2013  . ADHD (attention deficit hyperactivity disorder) 07/25/1999    History reviewed. No pertinent surgical history.   OB History   No obstetric history on file.     Family History  Problem Relation Age of Onset  . Hyperlipidemia Father   . Migraines Father   . Other Father        Dyslipidemia  . Heart disease Maternal Grandmother   . Migraines Maternal Grandmother   . Diabetes Maternal Grandmother   . Hyperlipidemia Paternal Grandmother   . Migraines Mother        Onset between the age of 8919 or [age]  . Anxiety disorder Sister   . Bladder Cancer Maternal  Grandfather        Died at 7050  . Heart attack Paternal Grandfather        Died at 954  . Other Paternal Grandfather        Staph infection  . Stroke Paternal Grandfather   . Migraines Maternal Aunt     Social History   Tobacco Use  . Smoking status: Passive Smoke Exposure - Never Smoker  . Smokeless tobacco: Never Used  Vaping Use  . Vaping Use: Never used  Substance Use Topics  . Alcohol use: Yes    Alcohol/week: 0.0 standard drinks    Comment: social  . Drug use: No    Home Medications Prior to Admission medications   Medication Sig Start Date End Date Taking? Authorizing Provider  medroxyPROGESTERone Acetate (DEPO-PROVERA IM) Inject into the muscle.    [provider]  venlafaxine XR (EFFEXOR-XR) 37.5 MG 24 hr capsule Take 1 capsule (37.5 mg total) by mouth daily with breakfast. 04/07/20   Nche, Bonna Gainsharlotte Lum, NP    Allergies    Patient has no known allergies.  Review of Systems   Review of Systems  All other systems reviewed and are negative. Ten systems reviewed and are negative for acute change, except as noted in the HPI.    Physical Exam Updated Vital Signs BP (!) 148/86 (BP Location: Right Arm)   Pulse (!) 139  Temp 98.6 F (37 C)   Resp 20   Ht 5\' 4"  (1.626 m)   Wt 83.5 kg   SpO2 100%   BMI 31.58 kg/m   Physical Exam Vitals and nursing note reviewed.  Constitutional:      General: She is not in acute distress.    Appearance: Normal appearance. She is not ill-appearing, toxic-appearing or diaphoretic.  HENT:     Head: Normocephalic and atraumatic.     Right Ear: External ear normal.     Left Ear: External ear normal.     Nose: Nose normal.     Mouth/Throat:     Mouth: Mucous membranes are moist.     Pharynx: Oropharynx is clear. No oropharyngeal exudate or posterior oropharyngeal erythema.     Comments: Uvula midline.  No significant erythema or exudates noted in the posterior oropharynx.  Readily handling secretions.  No hot potato  voice. Eyes:     General: No scleral icterus.       Right eye: No discharge.        Left eye: No discharge.     Extraocular Movements: Extraocular movements intact.     Conjunctiva/sclera: Conjunctivae normal.     Pupils: Pupils are equal, round, and reactive to light.     Comments: Extraocular movements are intact.  Pupils are equal, round, and reactive to light.  Cardiovascular:     Rate and Rhythm: Regular rhythm. Tachycardia present.     Pulses: Normal pulses.     Heart sounds: Normal heart sounds. No murmur heard.  No friction rub. No gallop.   Pulmonary:     Effort: Pulmonary effort is normal. No respiratory distress.     Breath sounds: Normal breath sounds. No stridor. No wheezing, rhonchi or rales.  Abdominal:     General: Abdomen is flat.     Palpations: Abdomen is soft.     Tenderness: There is no abdominal tenderness.  Musculoskeletal:        General: Normal range of motion.     Cervical back: Normal range of motion and neck supple. No tenderness.  Skin:    General: Skin is warm and dry.  Neurological:     General: No focal deficit present.     Mental Status: She is alert and oriented to person, place, and time.     Comments: Patient is oriented to person, place, and time. Patient phonates in clear, complete, and coherent sentences. Negative arm drift. Finger to nose intact bilaterally with no visible signs of dysmetria. Strength is 5/5 in all four extremities. Distal sensation intact in all four extremities.  Psychiatric:        Mood and Affect: Mood normal.        Behavior: Behavior normal.    ED Results / Procedures / Treatments   Labs (all labs ordered are listed, but only abnormal results are displayed) Labs Reviewed  COMPREHENSIVE METABOLIC PANEL - Abnormal; Notable for the following components:      Result Value   CO2 19 (*)    All other components within normal limits  CBC WITH DIFFERENTIAL/PLATELET - Abnormal; Notable for the following components:    Neutro Abs 8.6 (*)    All other components within normal limits  URINALYSIS, ROUTINE W REFLEX MICROSCOPIC - Abnormal; Notable for the following components:   Ketones, ur 40 (*)    All other components within normal limits  RESPIRATORY PANEL BY RT PCR (FLU A&B, COVID)  LACTIC ACID, PLASMA  PREGNANCY, URINE  EKG EKG Interpretation  Date/Time:  Sunday September 10 2020 13:59:50 EST Ventricular Rate:  116 PR Interval:    QRS Duration: 76 QT Interval:  298 QTC Calculation: 414 R Axis:   85 Text Interpretation: Sinus tachycardia Low voltage, precordial leads Borderline T abnormalities, diffuse leads No previous ECGs available Confirmed by Vanetta Mulders [phone]) on 09/10/2020 2:24:55 PM  Radiology CT Head Wo Contrast  Result Date: 09/10/2020 CLINICAL DATA:  Chronic headache EXAM: CT HEAD WITHOUT CONTRAST TECHNIQUE: Contiguous axial images were obtained from the base of the skull through the vertex without intravenous contrast. COMPARISON:  None. FINDINGS: Brain: No evidence of acute territorial infarction, hemorrhage, hydrocephalus,extra-axial collection or mass lesion/mass effect. Normal gray-white differentiation. Ventricles are normal in size and contour. Vascular: No hyperdense vessel or unexpected calcification. Skull: The skull is intact. No fracture or focal lesion identified. Sinuses/Orbits: The visualized paranasal sinuses and mastoid air cells are clear. The orbits and globes intact. Other: None IMPRESSION: No acute intracranial abnormality. Electronically Signed   By: Jonna Clark M.D.   On: 09/10/2020 16:05    Procedures Procedures   Medications Ordered in ED Medications  sodium chloride 0.9 % bolus 1,000 mL ( Intravenous Stopped 09/10/20 1525)  ondansetron (ZOFRAN) injection 4 mg (4 mg Intravenous Given 09/10/20 1422)  acetaminophen (TYLENOL) tablet 1,000 mg (1,000 mg Oral Given 09/10/20 1424)  ketorolac (TORADOL) 15 MG/ML injection 15 mg (15 mg Intravenous Given 09/10/20 1529)    sodium chloride 0.9 % bolus 500 mL ( Intravenous Stopped 09/10/20 1606)  prochlorperazine (COMPAZINE) injection 10 mg (10 mg Intravenous Given 09/10/20 1710)  diphenhydrAMINE (BENADRYL) injection 25 mg (25 mg Intravenous Given 09/10/20 1707)  dexamethasone (DECADRON) injection 10 mg (10 mg Intravenous Given 09/10/20 1804)    ED Course  I have reviewed the triage vital signs and the nursing notes.  Pertinent labs & imaging results that were available during my care of the patient were reviewed by me and considered in my medical decision making (see chart for details).  Clinical Course as of Sep 10 1810  Wynelle Link Sep 10, 2020  1524 Patient reassessed.  States that she initially was experiencing an improvement in her headaches but that it is starting to worsen once again.  She reports a history of headaches in the past but they are typically not of the severity or length.  Labs today are reassuring.  No leukocytosis.  Mild neutrophilia at 8.6.  No elevation in lactic acid.  Electrolytes within normal limits on CMP.  Given the new severity and chronicity of her headaches, will obtain a CT scan of the head.  Will give additional IV fluids as well as IV Toradol.  Will reassess.   [LJ]  1631 No acute intracranial abnormality.  CT Head Wo Contrast [LJ]  1707 Patient states she is still experiencing a moderate headache.  She was discussed with and evaluated by my attending physician Dr. Pricilla Loveless.  Discussed the possibility of an LP with the patient and she declined.  She would like to try a migraine cocktail.  We will give her Compazine and Benadryl this time.   [LJ]    Clinical Course User Index [LJ] Placido Sou, PA-C   MDM Rules/Calculators/A&P                          Patient is a 24 year old female who presents today with multiple symptoms.  Her chief complaint appears to be a worsening headache for the past 2  days.  She reports a history of severe headaches in the past but they typically do  not last this long and are not of the severity.  Patient states that she has been febrile for the past 2 days and has been controlling her fever with ibuprofen.  She was initially afebrile upon arrival but tachycardic around 140.  I obtained basic labs including a CBC, CMP, UA, respiratory panel, lactic acid.  All labs today were reassuring.  Her respiratory panel was negative.  She is vaccinated for COVID-19.  No elevation in her lactic acid.  UA with 40 ketones but otherwise benign.  CBC without leukocytosis.  Normal electrolytes on CMP.  Given the nature of her headache, I obtained a CT scan which was negative for acute intracranial abnormalities.  Patient was discussed with and evaluated by my attending physician Dr. Pricilla Loveless.  We discussed the possibility of an LP with the patient and she declined and requested a migraine cocktail. No nuchal rigidity. She was given Compazine as well as Benadryl and reassessed after about an hour.  She states her symptoms have almost completely resolved.  Her heart rate is now down around 70 bpm.  She is normotensive, afebrile, and not hypoxic.  Patient feels comfortable being discharged at this time.  She was given very strict return precautions and knows to return to the ER with any new or worsening symptoms.  She was given a dose of Decadron prior to discharge to help prevent rebound headache.  Her questions were answered and she was amicable at the time of discharge.  Final Clinical Impression(s) / ED Diagnoses Final diagnoses:  Bad headache   Rx / DC Orders ED Discharge Orders    None       Placido Sou, PA-C 09/10/20 1813    Vanetta Mulders, MD 10/10/20 1441

## 2020-09-10 NOTE — ED Notes (Signed)
Pt reports feeling much better after medications.  Provider aware, making arrangements for discharge.

## 2020-09-19 DIAGNOSIS — Z3042 Encounter for surveillance of injectable contraceptive: Secondary | ICD-10-CM | POA: Diagnosis not present

## 2020-10-06 ENCOUNTER — Telehealth (INDEPENDENT_AMBULATORY_CARE_PROVIDER_SITE_OTHER): Payer: BC Managed Care – PPO | Admitting: Nurse Practitioner

## 2020-10-06 ENCOUNTER — Encounter: Payer: Self-pay | Admitting: Nurse Practitioner

## 2020-10-06 ENCOUNTER — Other Ambulatory Visit: Payer: Self-pay

## 2020-10-06 ENCOUNTER — Other Ambulatory Visit (INDEPENDENT_AMBULATORY_CARE_PROVIDER_SITE_OTHER): Payer: BC Managed Care – PPO

## 2020-10-06 VITALS — Ht 64.0 in | Wt 182.0 lb

## 2020-10-06 DIAGNOSIS — R509 Fever, unspecified: Secondary | ICD-10-CM | POA: Diagnosis not present

## 2020-10-06 DIAGNOSIS — M62830 Muscle spasm of back: Secondary | ICD-10-CM | POA: Diagnosis not present

## 2020-10-06 LAB — CBC WITH DIFFERENTIAL/PLATELET
Basophils Absolute: 0 10*3/uL (ref 0.0–0.1)
Basophils Relative: 0.5 % (ref 0.0–3.0)
Eosinophils Absolute: 0.6 10*3/uL (ref 0.0–0.7)
Eosinophils Relative: 6.1 % — ABNORMAL HIGH (ref 0.0–5.0)
HCT: 39.6 % (ref 36.0–46.0)
Hemoglobin: 13.5 g/dL (ref 12.0–15.0)
Lymphocytes Relative: 38 % (ref 12.0–46.0)
Lymphs Abs: 3.5 10*3/uL (ref 0.7–4.0)
MCHC: 34.1 g/dL (ref 30.0–36.0)
MCV: 87.8 fl (ref 78.0–100.0)
Monocytes Absolute: 0.9 10*3/uL (ref 0.1–1.0)
Monocytes Relative: 9.5 % (ref 3.0–12.0)
Neutro Abs: 4.3 10*3/uL (ref 1.4–7.7)
Neutrophils Relative %: 45.9 % (ref 43.0–77.0)
Platelets: 433 10*3/uL — ABNORMAL HIGH (ref 150.0–400.0)
RBC: 4.5 Mil/uL (ref 3.87–5.11)
RDW: 12.7 % (ref 11.5–15.5)
WBC: 9.3 10*3/uL (ref 4.0–10.5)

## 2020-10-06 LAB — C-REACTIVE PROTEIN: CRP: <1 mg/dL (ref 0.5–20.0)

## 2020-10-06 LAB — TSH: TSH: 1.18 u[IU]/mL (ref 0.35–4.50)

## 2020-10-06 MED ORDER — CYCLOBENZAPRINE HCL 5 MG PO TABS: 5.0000 mg | ORAL_TABLET | Freq: Every day | ORAL | 0 refills | Status: DC

## 2020-10-06 NOTE — Addendum Note (Signed)
Addended by: Varney Biles on: 10/06/2020 04:37 PM   Modules accepted: Orders

## 2020-10-06 NOTE — Progress Notes (Signed)
Virtual Visit via Video Note  I connected with@ on 10/06/20 at 11:00 AM EST by a video enabled telemedicine application and verified that I am speaking with the correct person using two identifiers.  Location: Patient:Home Provider: Office Participants: patient and provider  I discussed the limitations of evaluation and management by telemedicine and the availability of in person appointments. I also discussed with the patient that there may be a patient responsible charge related to this service. The patient expressed understanding and agreed to proceed.  CC:Pt c/o of fever and body aches x1 month. Pt tested negative for COVID multiple times.   History of Present Illness: Ms. Stegall reports fever, malaise and fatigue x 41month. Onset on 09/08/2020 with fever of 102.1, chills and headache. She was evaluated by ED provider on 09/10/2020: CT head and labs completed (no acute finding, including negative for COVID and influenza). Her symptoms improved with headache cocktail from ED and  ibuprofen 800mg  every 8hrs prn at home. Fever returned on 09/12/2020 at 100.2 and on 09/26/20 at 99.9. She denies any rash, swollen lymph nodes, GI/GU symptoms or recent acute URI. She is sexually active with one female partner, Current use of depoprovera injection for contraception-last administered in November, 2021 , denies any signs of vaginitis or cervicitis. She works in a [employer] and owns a cat at home. Denies any recent animal scratch or bite. No recent international travel.  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 Onset this AM. No radiculopathy or paresthesia or weakness. Worse with bending of twisting.  Observations/Objective: Physical Exam Constitutional:      General: She is not in acute distress. Eyes:     Extraocular Movements: Extraocular movements intact.     Conjunctiva/sclera: Conjunctivae normal.  Pulmonary:     Effort: Pulmonary effort is normal.   Musculoskeletal:     Cervical back: Normal range of motion and neck supple.  Lymphadenopathy:     Cervical: No cervical adenopathy.  Neurological:     Mental Status: She is alert and oriented to person, place, and time.    Assessment and Plan: Kit was seen today for acute visit.  Diagnoses and all orders for this visit:  Fever of unknown origin -     CBC with Differential/Platelet; Future -     TSH; Future -     HIV antibody (with reflex); Future -     C-reactive protein; Future -     Bartonella Ab w/Reflex to Titer; Future -     Epstein-Barr virus VCA antibody panel; Future -     DG Chest 2 View -     Urinalysis w microscopic + reflex cultur; Future -     QuantiFERON-TB Gold Plus; Future -     Toxoplasma gondii antibody, IgM; Future  Muscle spasm of back -     cyclobenzaprine (FLEXERIL) 5 MG tablet; Take 1-2 tablets (5-10 mg total) by mouth at bedtime.   Follow Up Instructions: Go to lab for blood draw and urine collection.   I discussed the assessment and treatment plan with the patient. The patient was provided an opportunity to ask questions and all were answered. The patient agreed with the plan and demonstrated an understanding of the instructions.   The patient was advised to call back or seek an in-person evaluation if the symptoms worsen or if the condition fails to improve as anticipated.  Luz Brazen, NP

## 2020-10-06 NOTE — Addendum Note (Signed)
Addended by: Varney Biles on: 10/06/2020 04:40 PM   Modules accepted: Orders

## 2020-10-09 ENCOUNTER — Encounter: Payer: Self-pay | Admitting: Nurse Practitioner

## 2020-10-10 LAB — HIV ANTIBODY (ROUTINE TESTING W REFLEX): HIV 1&2 Ab, 4th Generation: NONREACTIVE

## 2020-10-10 LAB — EPSTEIN-BARR VIRUS VCA ANTIBODY PANEL
EBV NA IgG: 400 U/mL — ABNORMAL HIGH
EBV VCA IgG: 33 U/mL — ABNORMAL HIGH
EBV VCA IgM: <36 U/mL

## 2020-10-10 LAB — QUANTIFERON-TB GOLD PLUS
Mitogen-NIL: 7.23 IU/mL
NIL: 0.09 IU/mL
QuantiFERON-TB Gold Plus: NEGATIVE
TB1-NIL: 0.01 IU/mL
TB2-NIL: 0 IU/mL

## 2020-10-10 LAB — URINALYSIS W MICROSCOPIC + REFLEX CULTURE
Bacteria, UA: NONE SEEN /HPF
Bilirubin Urine: NEGATIVE
Glucose, UA: NEGATIVE
Hgb urine dipstick: NEGATIVE
Hyaline Cast: NONE SEEN /LPF
Leukocyte Esterase: NEGATIVE
Nitrites, Initial: NEGATIVE
Protein, ur: NEGATIVE
Specific Gravity, Urine: 1.03 (ref 1.001–1.03)
Squamous Epithelial / HPF: NONE SEEN /HPF (ref <=5)
WBC, UA: NONE SEEN /HPF (ref 0–5)
pH: 6 (ref 5.0–8.0)

## 2020-10-10 LAB — NO CULTURE INDICATED

## 2020-10-10 LAB — TOXOPLASMA GONDII ANTIBODY, IGM: Toxoplasma Antibody- IgM: <8 AU/mL

## 2020-10-12 LAB — RFLX B. HENSELAE IGG TITER: B. HENSELAE AB (IGG), TITER: 1:64 {titer} — ABNORMAL HIGH

## 2020-10-12 LAB — RFLX B. HENSELAE IGM TITER: B Henselae IgM Titer: 1:160 {titer} — ABNORMAL HIGH

## 2020-10-12 LAB — B. BURGDORFI ANTIBODIES BY WB

## 2020-10-12 LAB — BARTONELLA AB W/REFLEX TO TITER
B. henselae IgG Screen: POSITIVE — AB
B. henselae IgM Screen: POSITIVE — AB
B. quintana IgG Screen: NEGATIVE
B. quintana IgM Screen: NEGATIVE

## 2020-10-14 ENCOUNTER — Encounter: Payer: Self-pay | Admitting: Nurse Practitioner

## 2020-10-14 DIAGNOSIS — A449 Bartonellosis, unspecified: Secondary | ICD-10-CM

## 2020-10-14 DIAGNOSIS — I889 Nonspecific lymphadenitis, unspecified: Secondary | ICD-10-CM

## 2020-10-16 ENCOUNTER — Telehealth: Payer: Self-pay | Admitting: Nurse Practitioner

## 2020-10-16 DIAGNOSIS — A449 Bartonellosis, unspecified: Secondary | ICD-10-CM

## 2020-10-16 DIAGNOSIS — I889 Nonspecific lymphadenitis, unspecified: Secondary | ICD-10-CM | POA: Insufficient documentation

## 2020-10-16 MED ORDER — AZITHROMYCIN 250 MG PO TABS: 250.0000 mg | ORAL_TABLET | Freq: Every day | ORAL | 0 refills | Status: DC

## 2020-10-16 NOTE — Telephone Encounter (Signed)
-----   Message from [place], [place] sent at 10/16/2020  8:45 AM EST ----- Pt states she has not had any fever but she does think her lymph nodes are swollen but not bothersome.

## 2020-10-20 DIAGNOSIS — M9904 Segmental and somatic dysfunction of sacral region: Secondary | ICD-10-CM | POA: Diagnosis not present

## 2020-10-23 DIAGNOSIS — M6289 Other specified disorders of muscle: Secondary | ICD-10-CM | POA: Diagnosis not present

## 2020-10-23 DIAGNOSIS — M62838 Other muscle spasm: Secondary | ICD-10-CM | POA: Diagnosis not present

## 2020-10-23 DIAGNOSIS — M461 Sacroiliitis, not elsewhere classified: Secondary | ICD-10-CM | POA: Diagnosis not present

## 2020-10-31 ENCOUNTER — Ambulatory Visit: Payer: BC Managed Care – PPO | Admitting: Nurse Practitioner

## 2020-11-07 DIAGNOSIS — M62838 Other muscle spasm: Secondary | ICD-10-CM | POA: Diagnosis not present

## 2020-11-07 DIAGNOSIS — M6289 Other specified disorders of muscle: Secondary | ICD-10-CM | POA: Diagnosis not present

## 2020-11-07 DIAGNOSIS — M461 Sacroiliitis, not elsewhere classified: Secondary | ICD-10-CM | POA: Diagnosis not present

## 2020-11-14 DIAGNOSIS — M62838 Other muscle spasm: Secondary | ICD-10-CM | POA: Diagnosis not present

## 2020-11-14 DIAGNOSIS — M6289 Other specified disorders of muscle: Secondary | ICD-10-CM | POA: Diagnosis not present

## 2020-11-14 DIAGNOSIS — M461 Sacroiliitis, not elsewhere classified: Secondary | ICD-10-CM | POA: Diagnosis not present

## 2020-11-28 DIAGNOSIS — M461 Sacroiliitis, not elsewhere classified: Secondary | ICD-10-CM | POA: Diagnosis not present

## 2020-11-28 DIAGNOSIS — M62838 Other muscle spasm: Secondary | ICD-10-CM | POA: Diagnosis not present

## 2020-11-28 DIAGNOSIS — M6289 Other specified disorders of muscle: Secondary | ICD-10-CM | POA: Diagnosis not present

## 2020-12-12 DIAGNOSIS — Z3042 Encounter for surveillance of injectable contraceptive: Secondary | ICD-10-CM | POA: Diagnosis not present

## 2020-12-13 ENCOUNTER — Encounter: Payer: Self-pay | Admitting: Family Medicine

## 2020-12-13 ENCOUNTER — Telehealth (INDEPENDENT_AMBULATORY_CARE_PROVIDER_SITE_OTHER): Payer: BC Managed Care – PPO | Admitting: Family Medicine

## 2020-12-13 VITALS — Ht 64.0 in

## 2020-12-13 DIAGNOSIS — J029 Acute pharyngitis, unspecified: Secondary | ICD-10-CM

## 2020-12-13 NOTE — Progress Notes (Signed)
[facility] [address] [place] [address] Dept: [phone] Dept Fax: [phone]  Virtual Video Visit  I connected with Bridget Hartshorn on 12/13/20 at  4:00 PM EST by a video enabled telemedicine application and verified that I am speaking with the correct person using two identifiers.  Location patient: Home Location provider: Clinic Persons participating in the virtual visit: Patient, Provider  I discussed the limitations of evaluation and management by telemedicine and the availability of in person appointments. The patient expressed understanding and agreed to proceed.  Chief Complaint  Patient presents with  . Sore Throat    Sore throat, swollen lymph nodes started this morning. Positive for COVID 2 weeks ago     SUBJECTIVE:  HPI: Denise Galvan is a 25 y.o. female who presents with onset of a sore throat at about 3 am this morning. She had a recent COVID-19 infection 2 1/2 weeks ago, but this last only a few days and completely resolved. She was previously vaccinated.  With her current sore throat, she notes she does have some swollen lymph nodes int he neck. She denies fever, cough, or hoarseness. She has some mild congestion left over from her recent COVID infection. She tried looking in her throat and does not see any white patches. She is using some Dayquil and ibuprofen and has seen some improvement.  Past Medical History:  Diagnosis Date  . Headache(784.0)    No past surgical history on file.  Family History  Problem Relation Age of Onset  . Hyperlipidemia Father   . Migraines Father   . Other Father        Dyslipidemia  . Heart disease Maternal Grandmother   . Migraines Maternal Grandmother   . Diabetes Maternal Grandmother   . Hyperlipidemia Paternal Grandmother   . Migraines Mother        Onset between the age of 74 or 34  . Anxiety disorder Sister   . Bladder Cancer Maternal Grandfather         Died at 28  . Heart attack Paternal Grandfather        Died at 59  . Other Paternal Grandfather        Staph infection  . Stroke Paternal Grandfather   . Migraines Maternal Aunt    Social History   Tobacco Use  . Smoking status: Passive Smoke Exposure - Never Smoker  . Smokeless tobacco: Never Used  Vaping Use  . Vaping Use: Never used  Substance Use Topics  . Alcohol use: Yes    Alcohol/week: 0.0 standard drinks    Comment: social  . Drug use: No    Current Outpatient Medications:  .  cyclobenzaprine (FLEXERIL) 5 MG tablet, Take 1-2 tablets (5-10 mg total) by mouth at bedtime., Disp: 14 tablet, Rfl: 0 .  medroxyPROGESTERone Acetate (DEPO-PROVERA IM), Inject into the muscle., Disp: , Rfl:  .  azithromycin (ZITHROMAX Z-PAK) 250 MG tablet, Take 1 tablet (250 mg total) by mouth daily. Take 2tabs on first day, then 1tab once a day till complete (Patient not taking: Reported on 12/13/2020), Disp: 6 tablet, Rfl: 0 .  venlafaxine XR (EFFEXOR-XR) 37.5 MG 24 hr capsule, Take 1 capsule (37.5 mg total) by mouth daily with breakfast. (Patient not taking: Reported on 12/13/2020), Disp: 30 capsule, Rfl: 5  No Known Allergies  ROS: See pertinent positives and negatives per HPI.  OBSERVATIONS/OBJECTIVE:  VITALS per patient if applicable: Today's Vitals   12/13/20 1552  Height: 5\' 4"  (1.626 m)   Body mass index is 31.24 kg/m.  GENERAL: alert, oriented, appears well and in no acute distress  HEENT: atraumatic, conjunctiva clear, no obvious abnormalities on inspection of external nose and ears  NECK: normal movements of the head and neck  LUNGS: on inspection no signs of respiratory distress, breathing rate appears normal, no obvious gross SOB, gasping or wheezing, no conversational dyspnea  PSYCH/NEURO: pleasant and cooperative, no obvious depression or anxiety, speech and thought processing grossly intact  ASSESSMENT AND PLAN:  1. Acute viral pharyngitis Currently, she has  low risk for GABHS. We discussed likely viral etiology of her sore throat. We discussed home management of viral illness. I suggested she add hot tea with honey every few hours to her regimen. If fever or tonsillar exudates/swelling should develop, I recommend she be seen in Urgent Care.   I discussed the assessment and treatment plan with the patient. The patient was provided an opportunity to ask questions and all were answered. The patient agreed with the plan and demonstrated an understanding of the instructions.   The patient was advised to call back or seek an in-person evaluation if the symptoms worsen or if the condition fails to improve as anticipated.   , MD

## 2020-12-19 ENCOUNTER — Other Ambulatory Visit: Payer: Self-pay

## 2020-12-20 ENCOUNTER — Ambulatory Visit: Payer: BC Managed Care – PPO | Admitting: Family Medicine

## 2020-12-20 ENCOUNTER — Encounter: Payer: Self-pay | Admitting: Family Medicine

## 2020-12-20 VITALS — BP 128/72 | HR 103 | Temp 98.0°F | Ht 64.0 in | Wt 179.0 lb

## 2020-12-20 DIAGNOSIS — J020 Streptococcal pharyngitis: Secondary | ICD-10-CM | POA: Diagnosis not present

## 2020-12-20 LAB — POCT RAPID STREP A (OFFICE): Rapid Strep A Screen: POSITIVE — AB

## 2020-12-20 MED ORDER — PENICILLIN V POTASSIUM 500 MG PO TABS: 500.0000 mg | ORAL_TABLET | Freq: Three times a day (TID) | ORAL | 0 refills | Status: AC

## 2020-12-20 NOTE — Progress Notes (Signed)
[facility] [address] [place] [address] Dept: [phone] Dept Fax: [phone]  Acute Office Visit  Subjective:    Patient ID: Denise Galvan, female    DOB: 1996/01/16, 25 y.o..   MRN: [id number]  Chief Complaint  Patient presents with  . Sore Throat    Sore throat, swollen lymph nodes, nasal congestion symptoms x 1 week.     History of Present Illness:  Patient is in today with a 1-week history of sore throat, nasal congestion, and swollen lymph nodes in the left neck. She denies fever. She was seen in the 1st 24 hours for a video visit. She notes that her energy level is improved since then. She has been using Throat Coat tea with honey. This helps better than Chloraseptic spray. She has only had occasional cough with this.  Past Medical History: Patient Active Problem List   Diagnosis Date Noted  . Lymphadenitis 10/16/2020  . 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 Adjustment disorder with mixed anxiety and depressed mood 02/24/2020  . LGSIL on Pap smear of cervix 01/06/2020  . Increased frequency of urination 12/10/2018  . Routine general medical examination at a health care facility 12/02/2018  . Anxiety 05/28/2018  . Low back pain 05/28/2018  . Acne 05/28/2018  . Migraine with aura 11/01/2013  . Episodic tension type headache 11/01/2013  . ADHD (attention deficit hyperactivity disorder) 07/25/1999   No past surgical history on file.  Family History  Problem Relation Age of Onset  . Hyperlipidemia Father   . Migraines Father   . Other Father        Dyslipidemia  . Heart disease Maternal Grandmother   . Migraines Maternal Grandmother   . Diabetes Maternal Grandmother   . Hyperlipidemia Paternal Grandmother   . Migraines Mother        Onset between the age of 71 or 31  . Anxiety disorder Sister   . Bladder Cancer Maternal Grandfather        Died at 16  . Heart attack Paternal Grandfather         Died at 64  . Other Paternal Grandfather        Staph infection  . Stroke Paternal Grandfather   . Migraines Maternal Aunt    Outpatient Medications Prior to Visit  Medication Sig Dispense Refill  . cyclobenzaprine (FLEXERIL) 5 MG tablet Take 1-2 tablets (5-10 mg total) by mouth at bedtime. 14 tablet 0  . medroxyPROGESTERone Acetate (DEPO-PROVERA IM) Inject into the muscle.    . venlafaxine XR (EFFEXOR-XR) 37.5 MG 24 hr capsule Take 1 capsule (37.5 mg total) by mouth daily with breakfast. (Patient not taking: No sig reported) 30 capsule 5   No facility-administered medications prior to visit.   No Known Allergies    Objective:   Today's Vitals   12/20/20 1133  BP: 128/72  Pulse: (!) 103  Temp: 98 F (36.7 C)  TempSrc: Temporal  SpO2: 97%  Weight: 179 lb (81.2 kg)  Height: 5\' 4"  (1.626 m)   Body mass index is 30.73 kg/m.   General: Well developed, well nourished. No acute distress. HEENT: Normocephalic, non-traumatic. Conjunctiva mildly injected without drainage. External   ears normal. EAC and TMs normal bilaterally. Mucous membranes moist. The left tonsil had wo   white patches int he upper pole. There is some mucous drainage in the posterior oropharynx.  Good dentition. Neck: Supple. Mildly swollen left lymphadenopathy. No thyromegaly. Lungs: Clear to auscultation bilaterally. CV:  RRR without murmurs or rubs. Pulses 2+ bilaterally. Psych: Alert and oriented. Normal mood and affect.  LABS: Rapid Strep- positive  Health Maintenance Due  Topic Date Due  . Hepatitis C Screening  Never done  . COVID-19 Vaccine (3 - Booster for [employer] series) 08/22/2020  . PAP-Cervical Cytology Screening  10/23/2020  . PAP SMEAR-Modifier  10/23/2020     Assessment & Plan:  1. Strep pharyngitis Reviewed treatment for Strep infection and appropriate precautions. Follow-up as needed.  - POCT rapid strep A - penicillin v potassium (VEETID) 500 MG tablet; Take 1 tablet (500 mg total) by  mouth 3 (three) times daily for 10 days.  Dispense: 30 tablet; Refill: 0  Loyola Mast, MD

## 2020-12-20 NOTE — Patient Instructions (Signed)
Strep Throat, Adult Strep throat is an infection in the throat that is caused by bacteria. It is common during the cold months of the year. It mostly affects children who are 5-26 years old. However, people of all ages can get it at any time of the year. This infection spreads from person to person (is contagious) through coughing, sneezing, or having close contact. Your health care provider may use other names to describe the infection. It can be called tonsillitis (if there is swelling of the tonsils), or pharyngitis (if there is swelling at the back of the throat). What are the causes? This condition is caused by the Streptococcus pyogenes bacteria. What increases the risk? You are more likely to develop this condition if:  You care for school-age children, or are around school-age children. Children are more likely to get strep throat and may spread it to others.  You spend time in crowded places where the infection can spread easily.  You have close contact with someone who has strep throat. What are the signs or symptoms? Symptoms of this condition include:  Fever or chills.  Redness, swelling, or pain in the tonsils or throat.  Pain or difficulty when swallowing.  White or yellow spots on the tonsils or throat.  Tender glands in the neck and under the jaw.  Bad smelling breath.  Red rash all over the body. This is rare. How is this diagnosed? This condition is diagnosed by tests that check for the presence and the amount of bacteria that cause strep throat. They are:  Rapid strep test. Your throat is swabbed and checked for the presence of bacteria. Results are usually ready in minutes.  Throat culture test. Your throat is swabbed. The sample is placed in a cup that allows infections to grow. Results are usually ready in 1 or 2 days.   How is this treated? This condition may be treated with:  Medicines that kill germs (antibiotics).  Medicines that relieve pain or  fever. These include: ? Ibuprofen or acetaminophen. ? Aspirin, only for patients who are over the age of 18. ? Throat lozenges. ? Throat sprays. Follow these instructions at home: Medicines  Take over-the-counter and prescription medicines only as told by your health care provider.  Take your antibiotic medicine as told by your health care provider. Do not stop taking the antibiotic even if you start to feel better.   Eating and drinking  If you have trouble swallowing, try eating soft foods until your sore throat feels better.  Drink enough fluid to keep your urine pale yellow.  To help relieve pain, you may have: ? Warm fluids, such as soup and tea. ? Cold fluids, such as frozen desserts or popsicles.   General instructions  Gargle with a salt-water mixture 3-4 times a day or as needed. To make a salt-water mixture, completely dissolve -1 tsp (3-6 g) of salt in 1 cup (237 mL) of warm water.  Get plenty of rest.  Stay home from work or school until you have been taking antibiotics for 24 hours.  Avoid smoking or being around people who smoke.  Keep all follow-up visits as told by your health care provider. This is important. How is this prevented?  Do not share food, drinking cups, or personal items that could cause the infection to spread to other people.  Wash your hands well with soap and water, and make sure that all people in your house wash their hands well.  Have family   members tested if they have a sore throat or fever. They may need an antibiotic if they have strep throat.   Contact a health care provider if:  The glands in your neck continue to get bigger.  You develop a rash, cough, or earache.  You cough up a thick mucus that is green, yellow-brown, or bloody.  You have pain or discomfort that does not get better with medicine.  Your symptoms seem to be getting worse and not better.  You have a fever. Get help right away if:  You have new symptoms,  such as vomiting, severe headache, stiff or painful neck, chest pain, or shortness of breath.  You have severe throat pain, drooling, or changes in your voice.  You have swelling of the neck, or the skin on the neck becomes red and tender.  You have signs of dehydration, such as tiredness (fatigue), dry mouth, and decreased urination.  You become increasingly sleepy, or you cannot wake up completely.  Your joints become red or painful. Summary  Strep throat is an infection in the throat that is caused by the Streptococcus pyogenes bacteria. This infection is spread from person to person (is contagious) through coughing, sneezing, or having close contact.  Take your medicines, including antibiotics, as told by your health care provider. Do not stop taking the antibiotic even if you start to feel better.  To prevent the spread of germs, wash your hands well with soap and water. Have others do the same. Do not share food, drinking cups, or personal items.  Get help right away if you have new symptoms, such as vomiting, severe headache, stiff or painful neck, chest pain, or shortness of breath. This information is not intended to replace advice given to you by your health care provider. Make sure you discuss any questions you have with your health care provider. Document Revised: 01/08/2019 Document Reviewed: 01/08/2019 Elsevier Patient Education  2021 [employer] Inc.  

## 2021-01-02 DIAGNOSIS — M6289 Other specified disorders of muscle: Secondary | ICD-10-CM | POA: Diagnosis not present

## 2021-01-02 DIAGNOSIS — M461 Sacroiliitis, not elsewhere classified: Secondary | ICD-10-CM | POA: Diagnosis not present

## 2021-01-02 DIAGNOSIS — M62838 Other muscle spasm: Secondary | ICD-10-CM | POA: Diagnosis not present

## 2021-01-03 ENCOUNTER — Encounter: Payer: Self-pay | Admitting: Family Medicine

## 2021-01-03 DIAGNOSIS — J020 Streptococcal pharyngitis: Secondary | ICD-10-CM

## 2021-01-08 ENCOUNTER — Encounter: Payer: Self-pay | Admitting: Family Medicine

## 2021-01-08 ENCOUNTER — Other Ambulatory Visit: Payer: Self-pay

## 2021-01-08 ENCOUNTER — Ambulatory Visit: Payer: BC Managed Care – PPO | Admitting: Family Medicine

## 2021-01-08 VITALS — BP 118/70 | HR 104 | Temp 97.7°F | Ht 64.0 in | Wt 179.6 lb

## 2021-01-08 DIAGNOSIS — J020 Streptococcal pharyngitis: Secondary | ICD-10-CM

## 2021-01-08 LAB — POCT RAPID STREP A (OFFICE): Rapid Strep A Screen: POSITIVE — AB

## 2021-01-08 MED ORDER — AMOXICILLIN-POT CLAVULANATE 875-125 MG PO TABS: 1.0000 | ORAL_TABLET | Freq: Two times a day (BID) | ORAL | 0 refills | Status: DC

## 2021-01-08 NOTE — Progress Notes (Signed)
[facility] [address] [place] [address] Dept: [phone] Dept Fax: [phone]  Acute Office Visit  Subjective:    Patient ID: Denise Galvan, female    DOB: [birth year], 25 y.o..   MRN: [id number]  Chief Complaint  Patient presents with  . Follow-up    F/u ST after taking antibiotics for 1 week.     History of Present Illness:  Patient is in today with a recurrence of sore throat. She initially became sick earlier in Feb. with sore throat, nasal congestion, and swollen lymph nodes in the left neck. She was seen for a video on 2/9. She continued to have sore throat the following week, so presented in person for an evaluation. She did have left tonsillar exudates and a postive Rapid Strep. She was treated with a course of Pen V 500 mg tid for 10 days. She notes that she had seen improvement. However, in the past few days, she has had a return of sore throat and had a mild subjective fever last night.  Past Medical History: Patient Active Problem List   Diagnosis Date Noted  . Lymphadenitis 10/16/2020  . 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 Adjustment disorder with mixed anxiety and depressed mood 02/24/2020  . LGSIL on Pap smear of cervix 01/06/2020  . Increased frequency of urination 12/10/2018  . Routine general medical examination at a health care facility 12/02/2018  . Anxiety 05/28/2018  . Low back pain 05/28/2018  . Acne 05/28/2018  . Migraine with aura 11/01/2013  . Episodic tension type headache 11/01/2013  . ADHD (attention deficit hyperactivity disorder) 07/25/1999   No past surgical history on file.  Family History  Problem Relation Age of Onset  . Hyperlipidemia Father   . Migraines Father   . Other Father        Dyslipidemia  . Heart disease Maternal Grandmother   . Migraines Maternal Grandmother   . Diabetes Maternal Grandmother   . Hyperlipidemia Paternal Grandmother   . Migraines Mother         Onset between the age of 18 or 60  . Anxiety disorder Sister   . Bladder Cancer Maternal Grandfather        Died at 10  . Heart attack Paternal Grandfather        Died at 37  . Other Paternal Grandfather        Staph infection  . Stroke Paternal Grandfather   . Migraines Maternal Aunt    Outpatient Medications Prior to Visit  Medication Sig Dispense Refill  . cyclobenzaprine (FLEXERIL) 5 MG tablet Take 1-2 tablets (5-10 mg total) by mouth at bedtime. 14 tablet 0  . medroxyPROGESTERone Acetate (DEPO-PROVERA IM) Inject into the muscle.     No facility-administered medications prior to visit.   No Known Allergies    Objective:   Today's Vitals   01/08/21 0808  BP: 118/70  Pulse: (!) 104  Temp: 97.7 F (36.5 C)  TempSrc: Temporal  SpO2: 99%  Weight: 179 lb 9.6 oz (81.5 kg)  Height: 5\' 4"  (1.626 m)   Body mass index is 30.83 kg/m.   General: Well developed, well nourished. No acute distress. HEENT: Mucous membranes moist. There are white exudates on both tonsils. Good dentition. Psych: Alert and oriented. Normal mood and affect.  Health Maintenance Due  Topic Date Due  . Hepatitis C Screening  Never done  . HPV VACCINES (1 - 2-dose series) Never done  . COVID-19 Vaccine (3 -  Booster for [employer] series) 08/22/2020  . PAP-Cervical Cytology Screening  10/23/2020  . PAP SMEAR-Modifier  10/23/2020   Lab Results Rapid strep test is positive.    Assessment & Plan:   1. 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 I will switch to Augmentin. If symtoms do not resolve with this, she should return for evaluation.  - POCT rapid strep A - amoxicillin-clavulanate (AUGMENTIN) 875-125 MG tablet; Take 1 tablet by mouth 2 (two) times daily.  Dispense: 20 tablet; Refill: 0  Loyola Mast, MD

## 2021-01-09 ENCOUNTER — Encounter (HOSPITAL_BASED_OUTPATIENT_CLINIC_OR_DEPARTMENT_OTHER): Payer: Self-pay | Admitting: Emergency Medicine

## 2021-01-09 ENCOUNTER — Other Ambulatory Visit: Payer: Self-pay

## 2021-01-09 DIAGNOSIS — J020 Streptococcal pharyngitis: Secondary | ICD-10-CM | POA: Insufficient documentation

## 2021-01-09 DIAGNOSIS — Z7722 Contact with and (suspected) exposure to environmental tobacco smoke (acute) (chronic): Secondary | ICD-10-CM | POA: Insufficient documentation

## 2021-01-09 DIAGNOSIS — R000 Tachycardia, unspecified: Secondary | ICD-10-CM | POA: Diagnosis not present

## 2021-01-09 DIAGNOSIS — R070 Pain in throat: Secondary | ICD-10-CM | POA: Diagnosis not present

## 2021-01-09 MED ORDER — TRAMADOL HCL 50 MG PO TABS: 50.0000 mg | ORAL_TABLET | Freq: Three times a day (TID) | ORAL | 0 refills | Status: AC | PRN

## 2021-01-09 NOTE — Telephone Encounter (Signed)
Pt called and said the pain is a lot worse than yesterday, she said she took 3 doses of the antibiotic that was called in for her and she said shes been taking ibuprofen as well and its not helping, She described the pain as having a cut on your hand that you pour hand sanitizer on put in her throat, she wanted to know if there is anything else you can do for her. Please advise. Call back [phone]

## 2021-01-09 NOTE — ED Triage Notes (Signed)
Sore throat X 2 weeks Pos for strep then was given antibiotics had gotten but now is worse. Pain is gradually getting worse.

## 2021-01-10 ENCOUNTER — Emergency Department (HOSPITAL_BASED_OUTPATIENT_CLINIC_OR_DEPARTMENT_OTHER)
Admission: EM | Admit: 2021-01-10 | Discharge: 2021-01-10 | Disposition: A | Discharge status: Home / Self Care | Payer: BC Managed Care – PPO | Attending: Emergency Medicine | Admitting: Emergency Medicine

## 2021-01-10 DIAGNOSIS — J020 Streptococcal pharyngitis: Secondary | ICD-10-CM

## 2021-01-10 MED ORDER — DEXAMETHASONE SODIUM PHOSPHATE 10 MG/ML IJ SOLN
10.0000 mg | Freq: Once | INTRAMUSCULAR | Status: AC
  Administered 2021-01-10: 10 mg via INTRAMUSCULAR
  Filled 2021-01-10: qty 1

## 2021-01-10 NOTE — Discharge Instructions (Addendum)
Continue antibiotics for strep throat.  Decadron will take effect over the next 12 to 24 hours.  If you develop worsening sore throat, inability to swallow, you should be reevaluated.

## 2021-01-10 NOTE — ED Provider Notes (Signed)
[facility] EMERGENCY DEPARTMENT Provider Note   CSN: [id number] Arrival date & time: 01/09/21  2335     History Chief Complaint  Patient presents with  . Sore Throat    Denise Galvan is a 25 y.o. female.  HPI     This is a 25 year old female who presents with sore throat.  Patient reports she was diagnosed with strep throat 2 weeks ago.  She was put on to penicillin and felt like she had improved.  However, 3 days after stopping penicillin, she states she worsened again.  She saw her primary physician again and was started on Augmentin.  She states that her strep test remained positive.  She was also prescribed tramadol.  She took 1 dose of this yesterday.  However, she has progressive sore throat and difficulty swallowing.  She does report swollen lymph nodes.  Denies persistent fevers.  Has not seen an ENT physician.  She is concerned she may have a peritonsillar abscess and wanted to be examined.  She rates her pain at 9 out of 10.  Past Medical History:  Diagnosis Date  . OZDGUYQI(347.4)     Patient Active Problem List   Diagnosis Date Noted  . Lymphadenitis 10/16/2020  . 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 Adjustment disorder with mixed anxiety and depressed mood 02/24/2020  . LGSIL on Pap smear of cervix 01/06/2020  . Increased frequency of urination 12/10/2018  . Routine general medical examination at a health care facility 12/02/2018  . Anxiety 05/28/2018  . Low back pain 05/28/2018  . Acne 05/28/2018  . Migraine with aura 11/01/2013  . Episodic tension type headache 11/01/2013  . ADHD (attention deficit hyperactivity disorder) 07/25/1999    History reviewed. No pertinent surgical history.   OB History   No obstetric history on file.     Family History  Problem Relation Age of Onset  . Hyperlipidemia Father   . Migraines Father   . Other Father        Dyslipidemia  . Heart disease Maternal Grandmother   . Migraines Maternal Grandmother   .  Diabetes Maternal Grandmother   . Hyperlipidemia Paternal Grandmother   . Migraines Mother        Onset between the age of 64 or 52  . Anxiety disorder Sister   . Bladder Cancer Maternal Grandfather        Died at 7  . Heart attack Paternal Grandfather        Died at 17  . Other Paternal Grandfather        Staph infection  . Stroke Paternal Grandfather   . Migraines Maternal Aunt     Social History   Tobacco Use  . Smoking status: Passive Smoke Exposure - Never Smoker  . Smokeless tobacco: Never Used  Vaping Use  . Vaping Use: Never used  Substance Use Topics  . Alcohol use: Yes    Alcohol/week: 0.0 standard drinks    Comment: social  . Drug use: No    Home Medications Prior to Admission medications   Medication Sig Start Date End Date Taking? Authorizing Provider  amoxicillin-clavulanate (AUGMENTIN) 875-125 MG tablet Take 1 tablet by mouth 2 (two) times daily. 01/08/21   Loyola Mast, MD  cyclobenzaprine (FLEXERIL) 5 MG tablet Take 1-2 tablets (5-10 mg total) by mouth at bedtime. 10/06/20   Nche, Bonna Gains, NP  medroxyPROGESTERone Acetate (DEPO-PROVERA IM) Inject into the muscle.    [provider]  traMADol (ULTRAM) 50 MG tablet  Take 1 tablet (50 mg total) by mouth every 8 (eight) hours as needed for up to 5 days. 01/09/21 01/14/21  Loyola Mast, MD    Allergies    Patient has no known allergies.  Review of Systems   Review of Systems  Constitutional: Negative for fever.  HENT: Positive for sore throat and trouble swallowing.   Respiratory: Negative for shortness of breath.   Cardiovascular: Negative for chest pain.  Gastrointestinal: Negative for abdominal pain, nausea and vomiting.  All other systems reviewed and are negative.   Physical Exam Updated Vital Signs BP 131/87 (BP Location: Left Arm)   Pulse (!) 101   Temp 98.9 F (37.2 C) (Oral)   Resp 20   Ht 1.626 m (5\' 4" )   Wt 81.2 kg   SpO2 100%   BMI 30.73 kg/m   Physical  Exam Vitals and nursing note reviewed.  Constitutional:      Appearance: She is well-developed and well-nourished.  HENT:     Head: Normocephalic and atraumatic.     Nose: No congestion.     Mouth/Throat:     Comments: Posterior oropharynx erythematous, diffuse tonsillar exudate with palatal petechiae, uvula is midline but is edematous, tonsils are 2+ and symmetrically enlarged bilaterally Eyes:     Pupils: Pupils are equal, round, and reactive to light.  Cardiovascular:     Rate and Rhythm: Regular rhythm. Tachycardia present.  Pulmonary:     Effort: Pulmonary effort is normal. No respiratory distress.     Breath sounds: No wheezing.  Abdominal:     Palpations: Abdomen is soft.  Musculoskeletal:     Cervical back: Neck supple.  Skin:    General: Skin is warm and dry.  Neurological:     Mental Status: She is alert and oriented to person, place, and time.  Psychiatric:        Mood and Affect: Mood and affect and mood normal.     ED Results / Procedures / Treatments   Labs (all labs ordered are listed, but only abnormal results are displayed) Labs Reviewed - No data to display  EKG None  Radiology No results found.  Procedures Procedures   Medications Ordered in ED Medications  dexamethasone (DECADRON) injection 10 mg (has no administration in time range)    ED Course  I have reviewed the triage vital signs and the nursing notes.  Pertinent labs & imaging results that were available during my care of the patient were reviewed by me and considered in my medical decision making (see chart for details).    MDM Rules/Calculators/A&P                          Patient presents with persistent sore throat and difficulty swallowing.  She was diagnosed with strep pharyngitis and is on a second round of antibiotics.  She has also received Toradol.  Her physical exam is notable for tonsillar exudate, palatal petechiae, diffuse symmetric swelling of both the uvula and  bilateral tonsils.  There is no asymmetric tonsillar enlargement to suggest peritonsillar abscess at this time.  She has no stridor and is tolerating her secretions.  She is able to tolerate oral fluids.  Patient was given a dose of IM Decadron for swelling.  Recommend she continue her antibiotics as an outpatient follow-up with her primary physician and ENT.  After history, exam, and medical workup I feel the patient has been appropriately medically screened and is safe  for discharge home. Pertinent diagnoses were discussed with the patient. Patient was given return precautions.  Final Clinical Impression(s) / ED Diagnoses Final diagnoses:  Strep pharyngitis    Rx / DC Orders ED Discharge Orders    None       Shon Baton, MD 01/10/21 [phone]

## 2021-01-11 ENCOUNTER — Ambulatory Visit: Payer: BC Managed Care – PPO | Admitting: Family Medicine

## 2021-01-11 ENCOUNTER — Other Ambulatory Visit: Payer: Self-pay

## 2021-01-11 ENCOUNTER — Encounter: Payer: Self-pay | Admitting: Family Medicine

## 2021-01-11 VITALS — BP 120/68 | HR 95 | Temp 98.0°F | Ht 64.0 in | Wt 174.4 lb

## 2021-01-11 DIAGNOSIS — J029 Acute pharyngitis, unspecified: Secondary | ICD-10-CM

## 2021-01-11 LAB — CBC
HCT: 38.8 % (ref 36.0–46.0)
Hemoglobin: 13 g/dL (ref 12.0–15.0)
MCHC: 33.5 g/dL (ref 30.0–36.0)
MCV: 89.6 fl (ref 78.0–100.0)
Platelets: 547 10*3/uL — ABNORMAL HIGH (ref 150.0–400.0)
RBC: 4.33 Mil/uL (ref 3.87–5.11)
RDW: 12.9 % (ref 11.5–15.5)
WBC: 15.9 10*3/uL — ABNORMAL HIGH (ref 4.0–10.5)

## 2021-01-11 NOTE — Progress Notes (Signed)
[facility]IMARY [facility] [address] [place] [address] Dept: [phone] Dept Fax: [phone]  Acute Office Visit  Subjective:    Patient ID: Denise Galvan, female    DOB: [birth year], 25 y.o..   MRN: [id number]  Chief Complaint  Patient presents with  . Follow-up    F/u ST, went to the ER on 01/10/21. She is having trouble swallowing due to the pain.  Was unable to hardly eat or drink yesterday.     History of Present Illness:  Patient is in today for reassessment of her pharyngitis. She initially became sick earlier in Feb. with sore throat, nasal congestion, and swollen lymph nodes in the left neck. She was seen for a video on 2/9. She continued to have sore throat the following week, so presented in person for an evaluation. She did have left tonsillar exudates and a postive Rapid Strep. She was treated with a course of Pen V 500 mg tid for 10 days. She returned on 01/08/2021 with a recurrence of sore throat. Once again, she had a positive Rapid Strep. She was started on Augmentin. By the following day, she was noting more intense pain and difficulty with swallowing. I provided her with some Tramadol and asked her to push fluids. Later that day, she was having more difficulty with swallowing and a change in the voice. She presented to the ED and was treated with IM Decardon. She has noted some mild improvement in her pain and swelling since then. She is swallowing a slight bit better. She raised the concern for a possible mononucleosis infection. She has had some fatigue. She denies any abdominal pain.  Past Medical History: Patient Active Problem List   Diagnosis Date Noted  . Lymphadenitis 10/16/2020  . 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 Adjustment disorder with mixed anxiety and depressed mood 02/24/2020  . LGSIL on Pap smear of cervix 01/06/2020  . Increased frequency of urination 12/10/2018  . Routine general medical examination at  a health care facility 12/02/2018  . Anxiety 05/28/2018  . Low back pain 05/28/2018  . Acne 05/28/2018  . Migraine with aura 11/01/2013  . Episodic tension type headache 11/01/2013  . ADHD (attention deficit hyperactivity disorder) 07/25/1999   No past surgical history on file.  Family History  Problem Relation Age of Onset  . Hyperlipidemia Father   . Migraines Father   . Other Father        Dyslipidemia  . Heart disease Maternal Grandmother   . Migraines Maternal Grandmother   . Diabetes Maternal Grandmother   . Hyperlipidemia Paternal Grandmother   . Migraines Mother        Onset between the age of 58 or 87  . Anxiety disorder Sister   . Bladder Cancer Maternal Grandfather        Died at 74  . Heart attack Paternal Grandfather        Died at 32  . Other Paternal Grandfather        Staph infection  . Stroke Paternal Grandfather   . Migraines Maternal Aunt    Outpatient Medications Prior to Visit  Medication Sig Dispense Refill  . amoxicillin-clavulanate (AUGMENTIN) 875-125 MG tablet Take 1 tablet by mouth 2 (two) times daily. 20 tablet 0  . cyclobenzaprine (FLEXERIL) 5 MG tablet Take 1-2 tablets (5-10 mg total) by mouth at bedtime. 14 tablet 0  . medroxyPROGESTERone Acetate (DEPO-PROVERA IM) Inject into the muscle.    . traMADol (ULTRAM) 50 MG  tablet Take 1 tablet (50 mg total) by mouth every 8 (eight) hours as needed for up to 5 days. 15 tablet 0   No facility-administered medications prior to visit.   No Known Allergies    Objective:   Today's Vitals   01/11/21 1058  BP: 120/68  Pulse: 95  Temp: 98 F (36.7 C)  TempSrc: Temporal  SpO2: 98%  Weight: 174 lb 6.4 oz (79.1 kg)  Height: 5\' 4"  (1.626 m)   Body mass index is 29.94 kg/m.   General: Well developed, well nourished. No acute distress. HEENT: Normocephalic, non-traumatic. Eyes clear. Nose clear without congestion or rhinorrhea. Mucous    membranes moist. There are scattered grayish-white exudates  on both tonsils, but also now involving the   uvula and soft palate. Abdomen: No splenomegaly. Nontender. Psych: Alert and oriented. Normal mood and affect.  LABS: Monospot +  Health Maintenance Due  Topic Date Due  . Hepatitis C Screening  Never done  . HPV VACCINES (1 - 2-dose series) Never done  . COVID-19 Vaccine (3 - Booster for Pfizer series) 08/22/2020  . PAP-Cervical Cytology Screening  10/23/2020  . PAP SMEAR-Modifier  10/23/2020     Assessment & Plan:   1. Pharyngitis, unspecified etiology I reviewed her ED records. Her exam has definitely progressed ins everity from what I saw earlier this week. We discussed that her current pharyngitis may be secondary to infectious mononucleosis rather than Strep infection, despite the positive Rapid Strep test. I will check a CBC to see if she has lymphocytosis supporting this diagnosis. At present she has no rash, so will have her complete the course of Augmentin. If this were mono, we discussed that the appropriate treatment is pain relievers, rest, and hydration. She has already received a steroid injection for swelling. I don't believe further steroids are indicated at this point. She will follow-up if she does not continue to improve.  - POCT Mono 10/25/2020 Virus) - CBC    Malachi Carl, MD

## 2021-01-12 ENCOUNTER — Encounter: Payer: Self-pay | Admitting: Family Medicine

## 2021-01-12 ENCOUNTER — Other Ambulatory Visit (INDEPENDENT_AMBULATORY_CARE_PROVIDER_SITE_OTHER): Payer: BC Managed Care – PPO

## 2021-01-12 DIAGNOSIS — J029 Acute pharyngitis, unspecified: Secondary | ICD-10-CM

## 2021-01-12 LAB — WHITE CELL DIFFERENTIAL
Band Neutrophils: 0 %
Basophils Relative: 0.9 % (ref 0.0–3.0)
Eosinophils Relative: 0.6 % (ref 0.0–5.0)
Lymphocytes Relative: 26.6 % (ref 12.0–46.0)
Monocytes Relative: 7.1 % (ref 3.0–12.0)
Neutrophils Relative %: 64.8 % (ref 43.0–77.0)

## 2021-01-12 LAB — POCT MONO (EPSTEIN BARR VIRUS): Mono, POC: POSITIVE — AB

## 2021-01-12 NOTE — Addendum Note (Signed)
Addended by: Miguel Aschoff on: 01/12/2021 09:56 AM   Modules accepted: Orders

## 2021-03-06 DIAGNOSIS — Z6831 Body mass index (BMI) 31.0-31.9, adult: Secondary | ICD-10-CM | POA: Diagnosis not present

## 2021-03-06 DIAGNOSIS — Z3040 Encounter for surveillance of contraceptives, unspecified: Secondary | ICD-10-CM | POA: Diagnosis not present

## 2021-03-06 DIAGNOSIS — Z01419 Encounter for gynecological examination (general) (routine) without abnormal findings: Secondary | ICD-10-CM | POA: Diagnosis not present

## 2021-03-06 LAB — RESULTS CONSOLE HPV: CHL HPV: NEGATIVE

## 2021-03-06 LAB — HM PAP SMEAR

## 2021-03-13 ENCOUNTER — Ambulatory Visit (INDEPENDENT_AMBULATORY_CARE_PROVIDER_SITE_OTHER): Payer: BC Managed Care – PPO | Admitting: Nurse Practitioner

## 2021-03-13 ENCOUNTER — Encounter: Payer: Self-pay | Admitting: Nurse Practitioner

## 2021-03-13 ENCOUNTER — Other Ambulatory Visit: Payer: Self-pay

## 2021-03-13 VITALS — BP 102/66 | HR 89 | Temp 98.1°F | Ht 64.5 in | Wt 182.2 lb

## 2021-03-13 DIAGNOSIS — Z13220 Encounter for screening for lipoid disorders: Secondary | ICD-10-CM | POA: Diagnosis not present

## 2021-03-13 DIAGNOSIS — Z0000 Encounter for general adult medical examination without abnormal findings: Secondary | ICD-10-CM | POA: Diagnosis not present

## 2021-03-13 DIAGNOSIS — Z136 Encounter for screening for cardiovascular disorders: Secondary | ICD-10-CM

## 2021-03-13 LAB — TSH: TSH: 1.03 u[IU]/mL (ref 0.35–4.50)

## 2021-03-13 LAB — LIPID PANEL
Cholesterol: 220 mg/dL — ABNORMAL HIGH (ref 0–200)
HDL: 36.3 mg/dL — ABNORMAL LOW (ref >39.00)
LDL Cholesterol: 155 mg/dL — ABNORMAL HIGH (ref 0–99)
NonHDL: 184.16
Total CHOL/HDL Ratio: 6
Triglycerides: 144 mg/dL (ref 0.0–149.0)
VLDL: 28.8 mg/dL (ref 0.0–40.0)

## 2021-03-13 LAB — COMPREHENSIVE METABOLIC PANEL
ALT: 12 U/L (ref 0–35)
AST: 13 U/L (ref 0–37)
Albumin: 4.1 g/dL (ref 3.5–5.2)
Alkaline Phosphatase: 63 U/L (ref 39–117)
BUN: 10 mg/dL (ref 6–23)
CO2: 27 mEq/L (ref 19–32)
Calcium: 9.4 mg/dL (ref 8.4–10.5)
Chloride: 104 mEq/L (ref 96–112)
Creatinine, Ser: 0.83 mg/dL (ref 0.40–1.20)
GFR: 97.8 mL/min (ref >60.00)
Glucose, Bld: 95 mg/dL (ref 70–99)
Potassium: 4.5 mEq/L (ref 3.5–5.1)
Sodium: 137 mEq/L (ref 135–145)
Total Bilirubin: 0.4 mg/dL (ref 0.2–1.2)
Total Protein: 6.8 g/dL (ref 6.0–8.3)

## 2021-03-13 LAB — CBC WITH DIFFERENTIAL/PLATELET
Basophils Absolute: 0.1 10*3/uL (ref 0.0–0.1)
Basophils Relative: 0.7 % (ref 0.0–3.0)
Eosinophils Absolute: 0.1 10*3/uL (ref 0.0–0.7)
Eosinophils Relative: 1.8 % (ref 0.0–5.0)
HCT: 39.3 % (ref 36.0–46.0)
Hemoglobin: 13.5 g/dL (ref 12.0–15.0)
Lymphocytes Relative: 33.6 % (ref 12.0–46.0)
Lymphs Abs: 2.8 10*3/uL (ref 0.7–4.0)
MCHC: 34.2 g/dL (ref 30.0–36.0)
MCV: 89.5 fl (ref 78.0–100.0)
Monocytes Absolute: 0.4 10*3/uL (ref 0.1–1.0)
Monocytes Relative: 4.6 % (ref 3.0–12.0)
Neutro Abs: 5 10*3/uL (ref 1.4–7.7)
Neutrophils Relative %: 59.3 % (ref 43.0–77.0)
Platelets: 434 10*3/uL — ABNORMAL HIGH (ref 150.0–400.0)
RBC: 4.39 Mil/uL (ref 3.87–5.11)
RDW: 12.5 % (ref 11.5–15.5)
WBC: 8.4 10*3/uL (ref 4.0–10.5)

## 2021-03-13 NOTE — Progress Notes (Signed)
Subjective:    Patient ID: Denise Galvan, female    DOB: [birth year], 25 y.o.   MRN: [id number]  Patient presents today for CPE   HPI High HR reading per smart watch per she is asymptomatic.  Positive mono test 01/12/2021, symptoms have since resolve.  Sexual History (orientation,birth control, marital status, STD):sexually active, female partner, denies need for STD screen, breast and pelvic exam completed by GYN per patient.  Depression/Suicide: Depression screen [facility] 2/9 03/13/2021 03/09/2020 03/09/2020 02/24/2020 05/28/2018  Decreased Interest 0 1 0 2 0  Down, Depressed, Hopeless 0 1 0 2 0  PHQ - 2 Score 0 2 0 4 0  Altered sleeping - 3 - 3 -  Tired, decreased energy - 2 - 2 -  Change in appetite - 1 - 3 -  Feeling bad or failure about yourself  - 1 - 2 -  Trouble concentrating - 1 - 1 -  Moving slowly or fidgety/restless - 0 - 3 -  Suicidal thoughts - 0 - 0 -  PHQ-9 Score - 10 - 18 -  Difficult doing work/chores - - - Very difficult -   Vision:not needed per patient  Dental:up to date  Immunizations: (TDAP, Hep C screen, Pneumovax, Influenza, zoster)  Health Maintenance  Topic Date Due  . HPV Vaccine (1 - 2-dose series) Never done  . Hepatitis C Screening: USPSTF Recommendation to screen - Ages 15-79 yo.  Never done  . COVID-19 Vaccine (3 - Booster for Pfizer series) 08/22/2020  . Pap Smear  10/23/2020  . Pap Smear  10/23/2020  . Flu Shot  06/04/2021  . Tetanus Vaccine  12/02/2028  . HIV Screening  Completed   Diet:regular Exercise: none Weight:  Wt Readings from Last 3 Encounters:  03/13/21 182 lb 3.2 oz (82.6 kg)  01/11/21 174 lb 6.4 oz (79.1 kg)  01/09/21 179 lb (81.2 kg)   Fall Risk: Fall Risk  03/09/2020 02/24/2020 05/28/2018  Falls in the past year? 0 0 No  Number falls in past yr: 0 - -  Injury with Fall? 0 - -   Medications and allergies reviewed with patient and updated if appropriate.  Patient Active Problem List   Diagnosis Date Noted  . Lymphadenitis  10/16/2020  . 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 Adjustment disorder with mixed anxiety and depressed mood 02/24/2020  . LGSIL on Pap smear of cervix 01/06/2020  . Increased frequency of urination 12/10/2018  . Routine general medical examination at a health care facility 12/02/2018  . Anxiety 05/28/2018  . Low back pain 05/28/2018  . Acne 05/28/2018  . Migraine with aura 11/01/2013  . Episodic tension type headache 11/01/2013  . ADHD (attention deficit hyperactivity disorder) 07/25/1999    Current Outpatient Medications on File Prior to Visit  Medication Sig Dispense Refill  . cyclobenzaprine (FLEXERIL) 5 MG tablet Take 1-2 tablets (5-10 mg total) by mouth at bedtime. 14 tablet 0  . NORLYDA 0.35 MG tablet Take 1 tablet by mouth daily as needed.     No current facility-administered medications on file prior to visit.    Past Medical History:  Diagnosis Date  . Headache(784.0)     No past surgical history on file.  Social History   Socioeconomic History  . Marital status: Single    Spouse name: Not on file  . Number of children: 0  . Years of education: Not on file  . Highest education level: Not on file  Occupational History  . Not on file  Tobacco Use  . Smoking status: Passive Smoke Exposure - Never Smoker  . Smokeless tobacco: Never Used  Vaping Use  . Vaping Use: Never used  Substance and Sexual Activity  . Alcohol use: Yes    Alcohol/week: 0.0 standard drinks    Comment: social  . Drug use: No  . Sexual activity: Yes    Birth control/protection: Pill    Comment: Mother smokes  Other Topics Concern  . Not on file  Social History Narrative   Denise Galvan graduated from [employer] in 2015. 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   Lives with her parents and sibling.   Social Determinants of Health   Financial Resource Strain: Not on file  Food Insecurity: Not on file   Transportation Needs: Not on file  Physical Activity: Not on file  Stress: Not on file  Social Connections: Not on file    Family History  Problem Relation Age of Onset  . Hyperlipidemia Father   . Migraines Father   . Other Father        Dyslipidemia  . Heart disease Maternal Grandmother   . Migraines Maternal Grandmother   . Diabetes Maternal Grandmother   . Hyperlipidemia Paternal Grandmother   . Migraines Mother        Onset between the age of 63 or 63  . Anxiety disorder Sister   . Bladder Cancer Maternal Grandfather        Died at 72  . Heart attack Paternal Grandfather        Died at 44  . Other Paternal Grandfather        Staph infection  . Stroke Paternal Grandfather   . Migraines Maternal Aunt         Review of Systems  Constitutional: Negative for fever, malaise/fatigue and weight loss.  HENT: Negative for congestion and sore throat.   Eyes:       Negative for visual changes  Respiratory: Negative for cough and shortness of breath.   Cardiovascular: Negative for chest pain, palpitations and leg swelling.  Gastrointestinal: Negative for blood in stool, constipation, diarrhea and heartburn.  Genitourinary: Negative for dysuria, frequency and urgency.  Musculoskeletal: Negative for falls, joint pain and myalgias.  Skin: Negative for rash.  Neurological: Negative for dizziness, sensory change and headaches.  Endo/Heme/Allergies: Does not bruise/bleed easily.  Psychiatric/Behavioral: Negative for depression, substance abuse and suicidal ideas. The patient is not nervous/anxious and does not have insomnia.    Objective:   Vitals:   03/13/21 0946 03/13/21 1018  BP: (!) 166/66 102/66  Pulse: 89   Temp: 98.1 F (36.7 C)   SpO2: 99%    Body mass index is 30.79 kg/m.  Physical Examination:  Physical Exam Vitals reviewed.  Constitutional:      General: She is not in acute distress.    Appearance: She is well-developed. She is obese.  HENT:     Right  Ear: Tympanic membrane, ear canal and external ear normal.     Left Ear: Tympanic membrane, ear canal and external ear normal.  Eyes:     Extraocular Movements: Extraocular movements intact.     Conjunctiva/sclera: Conjunctivae normal.  Cardiovascular:     Rate and Rhythm: Normal rate and regular rhythm.     Pulses: Normal pulses.     Heart sounds: Normal heart sounds.  Pulmonary:     Effort: Pulmonary effort is normal. No respiratory distress.  Breath sounds: Normal breath sounds.  Chest:     Chest wall: No tenderness.  Abdominal:     General: Bowel sounds are normal.     Palpations: Abdomen is soft.  Genitourinary:    Comments: Deferred breast and pelvic exam to GYN Musculoskeletal:        General: Normal range of motion.     Cervical back: Normal range of motion and neck supple.     Right lower leg: No edema.     Left lower leg: No edema.  Lymphadenopathy:     Cervical: No cervical adenopathy.  Neurological:     Mental Status: She is alert and oriented to person, place, and time.     Deep Tendon Reflexes: Reflexes are normal and symmetric.  Psychiatric:        Mood and Affect: Mood normal.        Behavior: Behavior normal.        Thought Content: Thought content normal.    ASSESSMENT and PLAN: This visit occurred during the SARS-CoV-2 public health emergency.  Safety protocols were in place, including screening questions prior to the visit, additional usage of staff PPE, and extensive cleaning of exam room while observing appropriate contact time as indicated for disinfecting solutions.   Deiona was seen today for annual exam.  Diagnoses and all orders for this visit:  Preventative health care -     CBC w/Diff -     Lipid panel -     TSH -     Comprehensive metabolic panel  Encounter for lipid screening for cardiovascular disease -     Lipid panel      Problem List Items Addressed This Visit   None   Visit Diagnoses    Preventative health care    -   Primary   Relevant Orders   CBC w/Diff   Lipid panel   TSH   Comprehensive metabolic panel   Encounter for lipid screening for cardiovascular disease       Relevant Orders   Lipid panel      Follow up: Return in about 1 year (around 03/13/2022) for CPE (fasting).  Alysia Penna, NP

## 2021-03-13 NOTE — Patient Instructions (Signed)
Sign medical release to get PAP results from GYN.  Go to lab for blood draw.  Preventive Care 26-26 Years Old, Female Preventive care refers to lifestyle choices and visits with your health care provider that can promote health and wellness. This includes:  A yearly physical exam. This is also called an annual wellness visit.  Regular dental and eye exams.  Immunizations.  Screening for certain conditions.  Healthy lifestyle choices, such as: ? Eating a healthy diet. ? Getting regular exercise. ? Not using drugs or products that contain nicotine and tobacco. ? Limiting alcohol use. What can I expect for my preventive care visit? Physical exam Your health care provider may check your:  Height and weight. These may be used to calculate your BMI (body mass index). BMI is a measurement that tells if you are at a healthy weight.  Heart rate and blood pressure.  Body temperature.  Skin for abnormal spots. Counseling Your health care provider may ask you questions about your:  Past medical problems.  Family's medical history.  Alcohol, tobacco, and drug use.  Emotional well-being.  Home life and relationship well-being.  Sexual activity.  Diet, exercise, and sleep habits.  Work and work Statistician.  Access to firearms.  Method of birth control.  Menstrual cycle.  Pregnancy history. What immunizations do I need? Vaccines are usually given at various ages, according to a schedule. Your health care provider will recommend vaccines for you based on your age, medical history, and lifestyle or other factors, such as travel or where you work.   What tests do I need? Blood tests  Lipid and cholesterol levels. These may be checked every 5 years starting at age 13.  Hepatitis C test.  Hepatitis B test. Screening  Diabetes screening. This is done by checking your blood sugar (glucose) after you have not eaten for a while (fasting).  STD (sexually transmitted  disease) testing, if you are at risk.  BRCA-related cancer screening. This may be done if you have a family history of breast, ovarian, tubal, or peritoneal cancers.  Pelvic exam and Pap test. This may be done every 3 years starting at age 31. Starting at age 74, this may be done every 5 years if you have a Pap test in combination with an HPV test. Talk with your health care provider about your test results, treatment options, and if necessary, the need for more tests.   Follow these instructions at home: Eating and drinking  Eat a healthy diet that includes fresh fruits and vegetables, whole grains, lean protein, and low-fat dairy products.  Take vitamin and mineral supplements as recommended by your health care provider.  Do not drink alcohol if: ? Your health care provider tells you not to drink. ? You are pregnant, may be pregnant, or are planning to become pregnant.  If you drink alcohol: ? Limit how much you have to 0-1 drink a day. ? Be aware of how much alcohol is in your drink. In the [place] one drink equals one 12 oz bottle of beer (355 mL), one 5 oz glass of wine (148 mL), or one 1 oz glass of hard liquor (44 mL).   Lifestyle  Take daily care of your teeth and gums. Brush your teeth every morning and night with fluoride toothpaste. Floss one time each day.  Stay active. Exercise for at least 30 minutes 5 or more days each week.  Do not use any products that contain nicotine or tobacco, such as cigarettes,  e-cigarettes, and chewing tobacco. If you need help quitting, ask your health care provider.  Do not use drugs.  If you are sexually active, practice safe sex. Use a condom or other form of protection to prevent STIs (sexually transmitted infections).  If you do not wish to become pregnant, use a form of birth control. If you plan to become pregnant, see your health care provider for a prepregnancy visit.  Find healthy ways to cope with stress, such as: ? Meditation,  yoga, or listening to music. ? Journaling. ? Talking to a trusted person. ? Spending time with friends and family. Safety  Always wear your seat belt while driving or riding in a vehicle.  Do not drive: ? If you have been drinking alcohol. Do not ride with someone who has been drinking. ? When you are tired or distracted. ? While texting.  Wear a helmet and other protective equipment during sports activities.  If you have firearms in your house, make sure you follow all gun safety procedures.  Seek help if you have been physically or sexually abused. What's next?  Go to your health care provider once a year for an annual wellness visit.  Ask your health care provider how often you should have your eyes and teeth checked.  Stay up to date on all vaccines. This information is not intended to replace advice given to you by your health care provider. Make sure you discuss any questions you have with your health care provider. Document Revised: 06/18/2020 Document Reviewed: 07/02/2018 Elsevier Patient Education  2021 [employer].

## 2021-03-28 DIAGNOSIS — Z3042 Encounter for surveillance of injectable contraceptive: Secondary | ICD-10-CM | POA: Diagnosis not present

## 2021-04-05 DIAGNOSIS — Z20822 Contact with and (suspected) exposure to covid-19: Secondary | ICD-10-CM | POA: Diagnosis not present

## 2021-05-02 DIAGNOSIS — N9089 Other specified noninflammatory disorders of vulva and perineum: Secondary | ICD-10-CM | POA: Diagnosis not present

## 2021-05-02 DIAGNOSIS — Z1159 Encounter for screening for other viral diseases: Secondary | ICD-10-CM | POA: Diagnosis not present

## 2021-05-15 DIAGNOSIS — Z113 Encounter for screening for infections with a predominantly sexual mode of transmission: Secondary | ICD-10-CM | POA: Diagnosis not present

## 2021-05-15 DIAGNOSIS — N9089 Other specified noninflammatory disorders of vulva and perineum: Secondary | ICD-10-CM | POA: Diagnosis not present

## 2021-06-05 DIAGNOSIS — R234 Changes in skin texture: Secondary | ICD-10-CM | POA: Diagnosis not present

## 2021-06-05 DIAGNOSIS — N898 Other specified noninflammatory disorders of vagina: Secondary | ICD-10-CM | POA: Diagnosis not present

## 2021-06-13 DIAGNOSIS — Z3042 Encounter for surveillance of injectable contraceptive: Secondary | ICD-10-CM | POA: Diagnosis not present

## 2021-06-22 IMAGING — CT CT HEAD W/O CM
3 series · 16 of 47 positions shown, 19 images · non-contrast
Comparison: None.

CLINICAL DATA: Chronic headache

EXAM:
CT HEAD WITHOUT CONTRAST
TECHNIQUE: Contiguous axial images were obtained from the base of the skull
through the vertex without intravenous contrast.

[Series 2: head wo · axial · 0.45mm/px · z∈[+766,+906]mm · 10 of 34 slices shown, 13 images]
[im 3/34  brain]
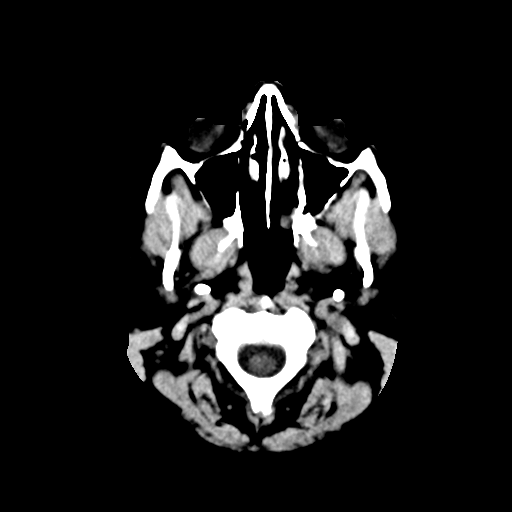
[im 3/34  bone]
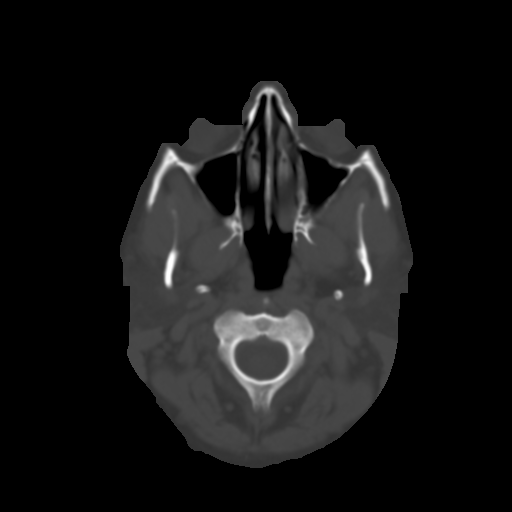
[im 6/34  brain]
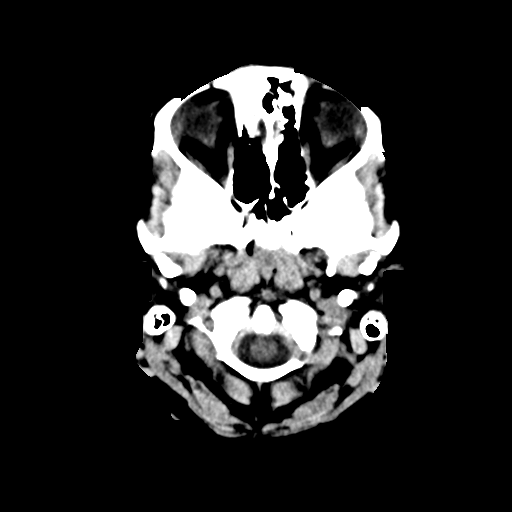
[im 10/34  brain]
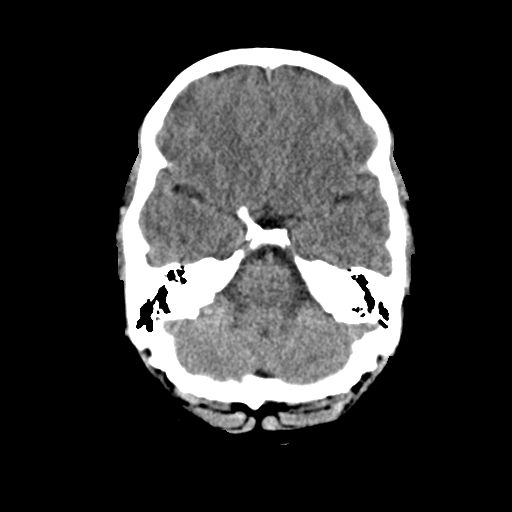
[im 12/34  brain]
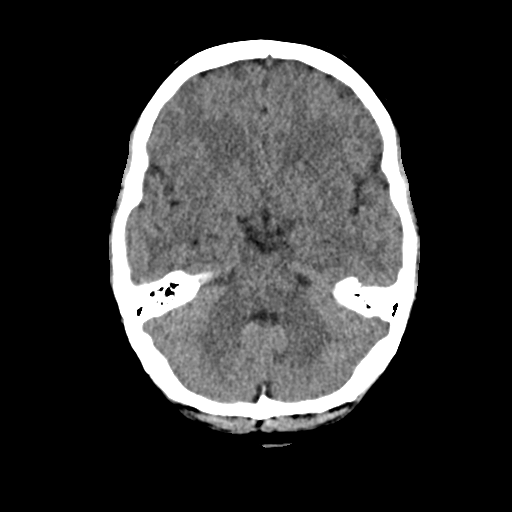
[im 15/34  brain]
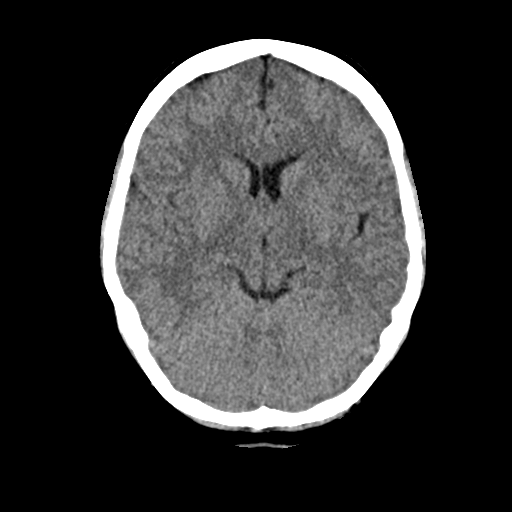
[im 15/34  bone]
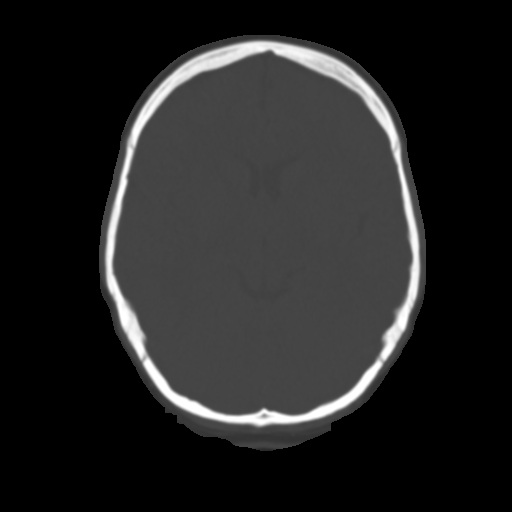
[im 19/34  brain]
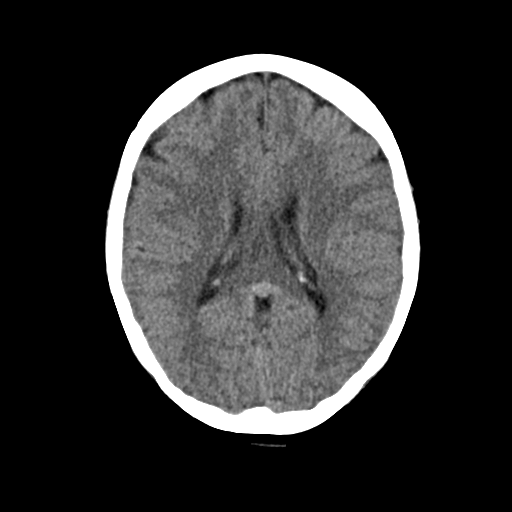
[im 22/34  brain]
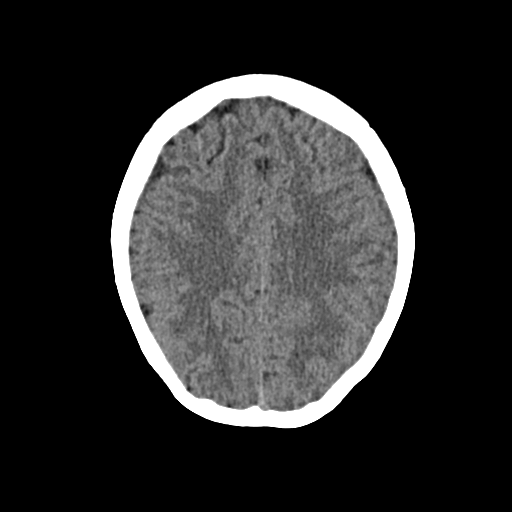
[im 26/34  brain]
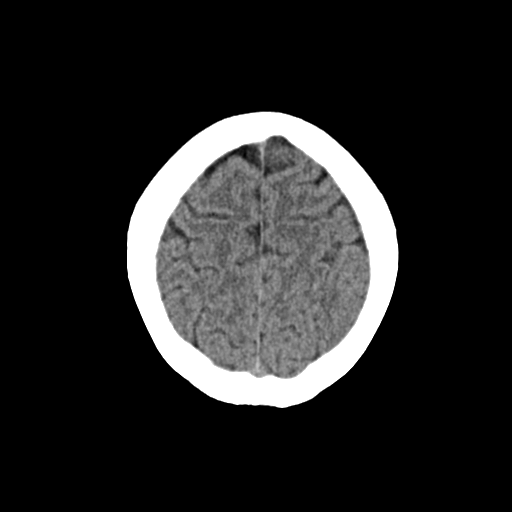
[im 28/34  brain]
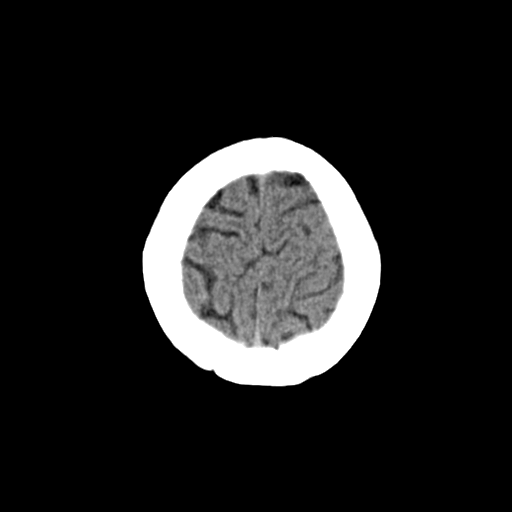
[im 28/34  bone]
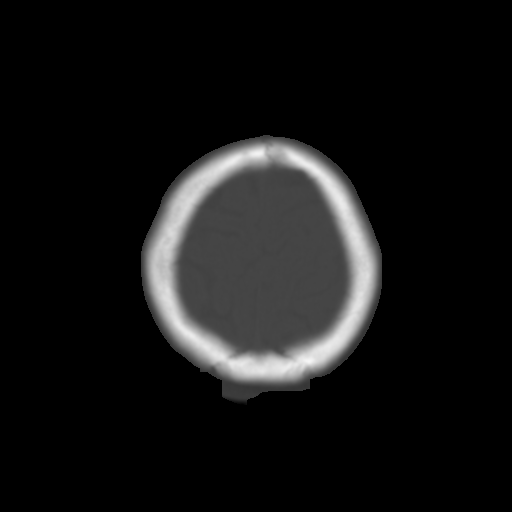
[im 31/34  brain]
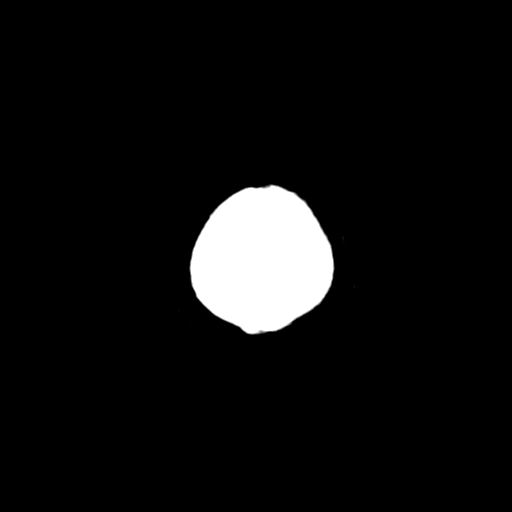

[Series 4: coronal soft · coronal · 0.34mm/px · 3 of 68 slices shown]
[im 23/68  brain]
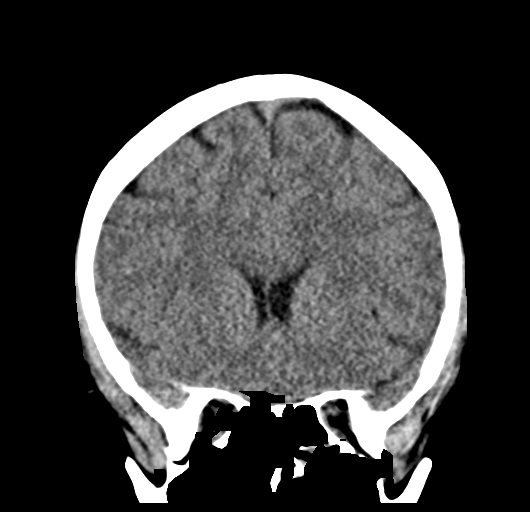
[im 30/68  brain]
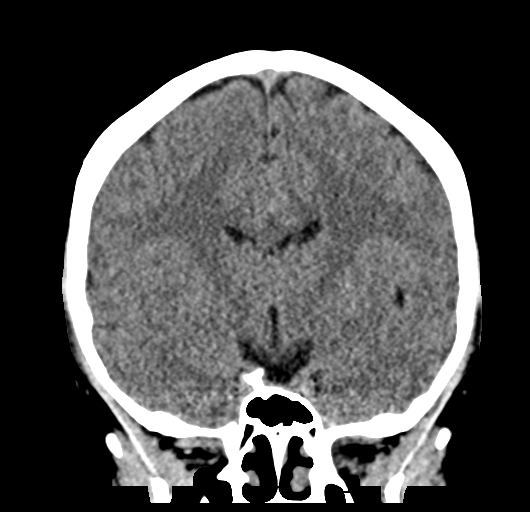
[im 38/68  brain]
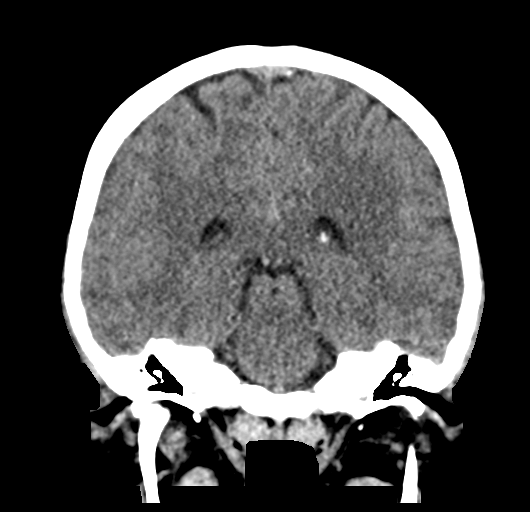

[Series 5: sag soft · sagittal · 0.34mm/px · 3 of 60 slices shown]
[im 20/60  brain]
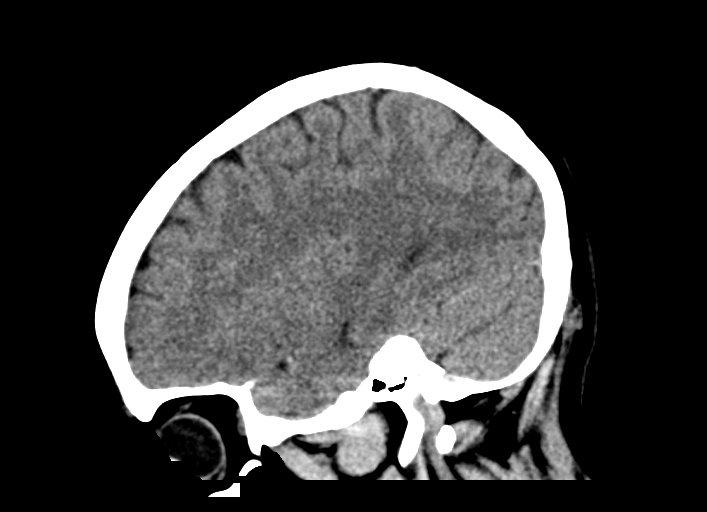
[im 30/60  brain]
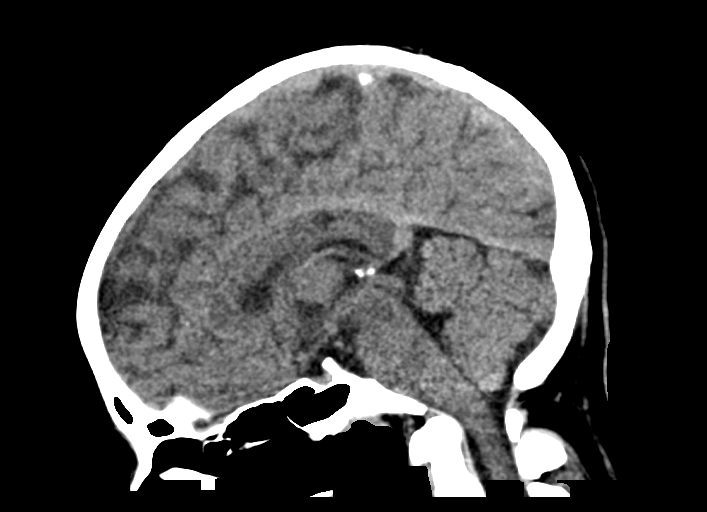
[im 40/60  brain]
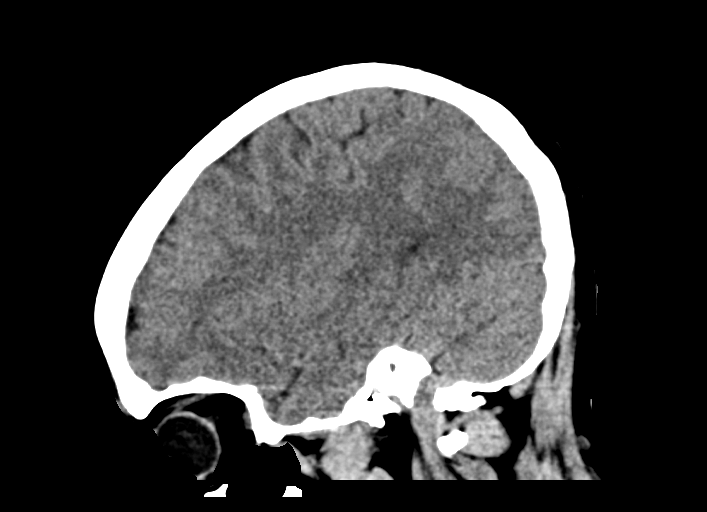

[16 of 47 positions shown; findings below may reference images not displayed]

FINDINGS: Brain: No evidence of acute territorial infarction, hemorrhage,
hydrocephalus,extra-axial collection or mass lesion/mass effect.
Normal gray-white differentiation. Ventricles are normal in size and
contour.

Vascular: No hyperdense vessel or unexpected calcification.

Skull: The skull is intact. No fracture or focal lesion identified.

Sinuses/Orbits: The visualized paranasal sinuses and mastoid air
cells are clear. The orbits and globes intact.

Other: None
IMPRESSION: No acute intracranial abnormality.

## 2022-05-21 DIAGNOSIS — Z124 Encounter for screening for malignant neoplasm of cervix: Secondary | ICD-10-CM | POA: Diagnosis not present

## 2022-05-21 DIAGNOSIS — Z01419 Encounter for gynecological examination (general) (routine) without abnormal findings: Secondary | ICD-10-CM | POA: Diagnosis not present

## 2022-05-21 DIAGNOSIS — Z6829 Body mass index (BMI) 29.0-29.9, adult: Secondary | ICD-10-CM | POA: Diagnosis not present

## 2022-05-21 DIAGNOSIS — Z1151 Encounter for screening for human papillomavirus (HPV): Secondary | ICD-10-CM | POA: Diagnosis not present

## 2022-10-31 ENCOUNTER — Encounter (HOSPITAL_COMMUNITY): Payer: Self-pay

## 2022-10-31 ENCOUNTER — Ambulatory Visit (HOSPITAL_COMMUNITY)
Admission: EM | Admit: 2022-10-31 | Discharge: 2022-10-31 | Disposition: A | Discharge status: Home / Self Care | Payer: Worker's Compensation | Attending: Internal Medicine | Admitting: Internal Medicine

## 2022-10-31 DIAGNOSIS — Z23 Encounter for immunization: Secondary | ICD-10-CM | POA: Diagnosis not present

## 2022-10-31 DIAGNOSIS — S61451A Open bite of right hand, initial encounter: Secondary | ICD-10-CM | POA: Diagnosis not present

## 2022-10-31 DIAGNOSIS — W5501XA Bitten by cat, initial encounter: Secondary | ICD-10-CM

## 2022-10-31 DIAGNOSIS — Z203 Contact with and (suspected) exposure to rabies: Secondary | ICD-10-CM | POA: Diagnosis not present

## 2022-10-31 MED ORDER — RABIES IMMUNE GLOBULIN 150 UNIT/ML IM INJ
INJECTION | INTRAMUSCULAR | Status: AC
  Filled 2022-10-31: qty 10

## 2022-10-31 MED ORDER — RABIES IMMUNE GLOBULIN 150 UNIT/ML IM INJ
20.0000 [IU]/kg | INJECTION | Freq: Once | INTRAMUSCULAR | Status: AC
  Administered 2022-10-31: 1725 [IU]

## 2022-10-31 MED ORDER — RABIES VACCINE, PCEC IM SUSR
1.0000 mL | Freq: Once | INTRAMUSCULAR | Status: AC
  Administered 2022-10-31: 1 mL via INTRAMUSCULAR

## 2022-10-31 MED ORDER — RABIES IMMUNE GLOBULIN 150 UNIT/ML IM INJ
INJECTION | INTRAMUSCULAR | Status: AC
  Filled 2022-10-31: qty 2

## 2022-10-31 MED ORDER — RABIES VACCINE, PCEC IM SUSR
INTRAMUSCULAR | Status: AC
  Filled 2022-10-31: qty 1

## 2022-10-31 MED ORDER — AMOXICILLIN-POT CLAVULANATE 875-125 MG PO TABS: 1.0000 | ORAL_TABLET | Freq: Two times a day (BID) | ORAL | 0 refills | Status: AC

## 2022-10-31 NOTE — ED Provider Notes (Signed)
[facility]    CSN: [id number] Arrival date & time: 10/31/22  0086      History   Chief Complaint Chief Complaint  Patient presents with   Animal Bite    HPI SHARHONDA ATWOOD is a 26 y.o. female comes to the urgent care for a cat bite of the right hand this morning.  The cat was a stray cat and it is not vaccinated against rabies.  The patient works as a Administrator, [employer] and the bite happened at her workplace today.  Bleeding is controlled.  Patient is not vaccinated against rabies.  Denies any numbness, tingling of the right hand.  HPI  Past Medical History:  Diagnosis Date   Headache(784.0)     Patient Active Problem List   Diagnosis Date Noted   Lymphadenitis 10/16/2020   Bartonella infection 10/16/2020   Adjustment disorder with mixed anxiety and depressed mood 02/24/2020   LGSIL on Pap smear of cervix 01/06/2020   Increased frequency of urination 12/10/2018   Routine general medical examination at a health care facility 12/02/2018   Anxiety 05/28/2018   Low back pain 05/28/2018   Acne 05/28/2018   Migraine with aura 11/01/2013   Episodic tension type headache 11/01/2013   ADHD (attention deficit hyperactivity disorder) 07/25/1999    History reviewed. No pertinent surgical history.  OB History   No obstetric history on file.      Home Medications    Prior to Admission medications   Medication Sig Start Date End Date Taking? Authorizing Provider  amoxicillin-clavulanate (AUGMENTIN) 875-125 MG tablet Take 1 tablet by mouth every 12 (twelve) hours for 5 days. 10/31/22 11/05/22 Yes Malisa Ruggiero, Britta Mccreedy, MD  cyclobenzaprine (FLEXERIL) 5 MG tablet Take 1-2 tablets (5-10 mg total) by mouth at bedtime. 10/06/20   Nche, Bonna Gains, NP  NORLYDA 0.35 MG tablet Take 1 tablet by mouth daily as needed. 03/06/21   [provider]    Family History Family History  Problem Relation Age of Onset   Hyperlipidemia Father    Migraines Father    Other Father         Dyslipidemia   Heart disease Maternal Grandmother    Migraines Maternal Grandmother    Diabetes Maternal Grandmother    Hyperlipidemia Paternal Grandmother    Migraines Mother        Onset between the age of 57 or 21   Anxiety disorder Sister    Bladder Cancer Maternal Grandfather        Died at 25   Heart attack Paternal Grandfather        Died at 75   Other Paternal Grandfather        Staph infection   Stroke Paternal Grandfather    Migraines Maternal Aunt     Social History Social History   Tobacco Use   Smoking status: Passive Smoke Exposure - Never Smoker   Smokeless tobacco: Never  Vaping Use   Vaping Use: Never used  Substance Use Topics   Alcohol use: Yes    Alcohol/week: 0.0 standard drinks of alcohol    Comment: social   Drug use: No     Allergies   Patient has no known allergies.   Review of Systems Review of Systems  Gastrointestinal: Negative.   Musculoskeletal: Negative.   Skin:  Positive for wound. Negative for color change.     Physical Exam Triage Vital Signs ED Triage Vitals  Enc Vitals Group     BP 10/31/22 1032 (!) 134/91  Pulse Rate 10/31/22 1032 92     Resp 10/31/22 1032 18     Temp 10/31/22 1032 97.6 F (36.4 C)     Temp Source 10/31/22 1032 Oral     SpO2 10/31/22 1032 99 %     Weight 10/31/22 1121 189 lb (85.7 kg)     Height --      Head Circumference --      Peak Flow --      Pain Score 10/31/22 1035 2     Pain Loc --      Pain Edu? --      Excl. in GC? --    No data found.  Updated Vital Signs BP (!) 134/91 (BP Location: Left Arm)   Pulse 92   Temp 97.6 F (36.4 C) (Oral)   Resp 18   Wt 85.7 kg   SpO2 99%   BMI 31.94 kg/m   Visual Acuity Right Eye Distance:   Left Eye Distance:   Bilateral Distance:    Right Eye Near:   Left Eye Near:    Bilateral Near:     Physical Exam Vitals and nursing note reviewed.  Constitutional:      General: She is not in acute distress.    Appearance: She is not  ill-appearing.  Cardiovascular:     Rate and Rhythm: Normal rate and regular rhythm.  Musculoskeletal:        General: Normal range of motion.  Skin:    Comments: Multiple bite wounds on the thenar aspect of the right hand as well as superficial lacerations on the distal part of the right forearm.  Distal laceration measures about 5 inches long.  No swelling in the fingers.  Neurovascular status is intact  Neurological:     Mental Status: She is alert.      UC Treatments / Results  Labs (all labs ordered are listed, but only abnormal results are displayed) Labs Reviewed - No data to display  EKG   Radiology No results found.  Procedures Procedures (including critical care time)  Medications Ordered in UC Medications  rabies immune globulin (HYPERRAB/KEDRAB) injection 1,725 Units (has no administration in time range)  rabies vaccine (RABAVERT) injection 1 mL (has no administration in time range)    Initial Impression / Assessment and Plan / UC Course  I have reviewed the triage vital signs and the nursing notes.  Pertinent labs & imaging results that were available during my care of the patient were reviewed by me and considered in my medical decision making (see chart for details).     1.  Cat bite of the right hand: Augmentin twice daily for 5 days Tylenol as needed for pain Rabies immunoglobulin Rabies vaccine to date Patient will need subsequent rabies vaccines on day 3, 7 and 14 Return precautions given. Final Clinical Impressions(s) / UC Diagnoses   Final diagnoses:  Cat bite of right hand, initial encounter     Discharge Instructions      Please return to the urgent care for subsequent rabies vaccinations on 11/03/22 (Day 3); 1/4 (Day 7); 1/11(Day 14). Please take medications as prescribed You may take ibuprofen or Tylenol as needed for pain If you notice worsening redness, swelling and/or discharge please return to urgent care to be  reevaluated.    ED Prescriptions     Medication Sig Dispense Auth. Provider   amoxicillin-clavulanate (AUGMENTIN) 875-125 MG tablet Take 1 tablet by mouth every 12 (twelve) hours for 5 days. 10 tablet  Geri Hepler, Britta Mccreedy, MD      PDMP not reviewed this encounter.   Merrilee Jansky, MD 10/31/22 [phone]

## 2022-10-31 NOTE — ED Triage Notes (Signed)
Pt was bit on her right hand by a stray cat. Unknown vaccine history.

## 2022-10-31 NOTE — ED Notes (Signed)
Patient is researching and discussing situation with employer/insurance

## 2022-10-31 NOTE — ED Notes (Signed)
Notified dr Leonides Grills of patient's concern

## 2022-10-31 NOTE — Discharge Instructions (Addendum)
Please return to the urgent care for subsequent rabies vaccinations on 11/03/22 (Day 3); 1/4 (Day 7); 1/11(Day 14). Please take medications as prescribed You may take ibuprofen or Tylenol as needed for pain If you notice worsening redness, swelling and/or discharge please return to urgent care to be reevaluated.

## 2022-11-03 ENCOUNTER — Encounter (HOSPITAL_COMMUNITY): Payer: Self-pay

## 2022-11-03 ENCOUNTER — Ambulatory Visit (HOSPITAL_COMMUNITY)
Admission: EM | Admit: 2022-11-03 | Discharge: 2022-11-03 | Disposition: A | Discharge status: Home / Self Care | Payer: Worker's Compensation | Attending: Internal Medicine | Admitting: Internal Medicine

## 2022-11-03 DIAGNOSIS — Z203 Contact with and (suspected) exposure to rabies: Secondary | ICD-10-CM

## 2022-11-03 MED ORDER — RABIES VACCINE, PCEC IM SUSR
1.0000 mL | Freq: Once | INTRAMUSCULAR | Status: AC
  Administered 2022-11-03: 1 mL via INTRAMUSCULAR

## 2022-11-03 MED ORDER — RABIES VACCINE, PCEC IM SUSR
INTRAMUSCULAR | Status: AC
  Filled 2022-11-03: qty 1

## 2022-11-03 NOTE — ED Notes (Signed)
Pt is here for rabbies vaccine # 2 in the RA. Pt tolerated vaccine

## 2022-11-03 NOTE — ED Triage Notes (Signed)
Pt is here for rabbies vaccine # 2 in the RA

## 2022-11-07 ENCOUNTER — Ambulatory Visit (HOSPITAL_COMMUNITY)
Admission: EM | Admit: 2022-11-07 | Discharge: 2022-11-07 | Disposition: A | Discharge status: Home / Self Care | Payer: Worker's Compensation | Attending: Internal Medicine | Admitting: Internal Medicine

## 2022-11-07 ENCOUNTER — Encounter (HOSPITAL_COMMUNITY): Payer: Self-pay

## 2022-11-07 DIAGNOSIS — Z203 Contact with and (suspected) exposure to rabies: Secondary | ICD-10-CM | POA: Diagnosis not present

## 2022-11-07 MED ORDER — RABIES VACCINE, PCEC IM SUSR
INTRAMUSCULAR | Status: AC
  Filled 2022-11-07: qty 1

## 2022-11-07 MED ORDER — RABIES VACCINE, PCEC IM SUSR
1.0000 mL | Freq: Once | INTRAMUSCULAR | Status: AC
  Administered 2022-11-07: 1 mL via INTRAMUSCULAR

## 2022-11-07 NOTE — ED Notes (Signed)
Patient presenting for day 7 rabies vaccine.

## 2022-11-07 NOTE — ED Notes (Signed)
Rabies vaccine administered in the left deltoid and tolerated well.

## 2022-11-14 ENCOUNTER — Ambulatory Visit (HOSPITAL_COMMUNITY)
Admission: RE | Admit: 2022-11-14 | Discharge: 2022-11-14 | Disposition: A | Discharge status: Home / Self Care | Payer: Worker's Compensation | Source: Ambulatory Visit | Attending: Internal Medicine | Admitting: Internal Medicine

## 2022-11-14 ENCOUNTER — Encounter (HOSPITAL_COMMUNITY): Payer: Self-pay

## 2022-11-14 DIAGNOSIS — Z203 Contact with and (suspected) exposure to rabies: Secondary | ICD-10-CM | POA: Diagnosis not present

## 2022-11-14 MED ORDER — RABIES VACCINE, PCEC IM SUSR
1.0000 mL | Freq: Once | INTRAMUSCULAR | Status: AC
  Administered 2022-11-14: 1 mL via INTRAMUSCULAR

## 2022-11-14 MED ORDER — RABIES VACCINE, PCEC IM SUSR
INTRAMUSCULAR | Status: AC
  Filled 2022-11-14: qty 1

## 2022-11-14 NOTE — ED Triage Notes (Signed)
Pt reports here for last rabies vaccination.

## 2023-09-03 ENCOUNTER — Telehealth: Payer: Self-pay | Admitting: Nurse Practitioner

## 2023-09-03 NOTE — Telephone Encounter (Signed)
Lvmtcb to schedule an appt

## 2023-12-22 ENCOUNTER — Emergency Department (HOSPITAL_COMMUNITY)
Admission: EM | Admit: 2023-12-22 | Discharge: 2023-12-22 | Disposition: A | Discharge status: Home / Self Care | Payer: Worker's Compensation | Attending: Emergency Medicine | Admitting: Emergency Medicine

## 2023-12-22 ENCOUNTER — Encounter (HOSPITAL_COMMUNITY): Payer: Self-pay | Admitting: Emergency Medicine

## 2023-12-22 DIAGNOSIS — Z7722 Contact with and (suspected) exposure to environmental tobacco smoke (acute) (chronic): Secondary | ICD-10-CM | POA: Diagnosis not present

## 2023-12-22 DIAGNOSIS — S0185XA Open bite of other part of head, initial encounter: Secondary | ICD-10-CM

## 2023-12-22 DIAGNOSIS — W540XXA Bitten by dog, initial encounter: Secondary | ICD-10-CM | POA: Insufficient documentation

## 2023-12-22 DIAGNOSIS — S01551A Open bite of lip, initial encounter: Secondary | ICD-10-CM | POA: Insufficient documentation

## 2023-12-22 LAB — HCG, SERUM, QUALITATIVE: Preg, Serum: NEGATIVE

## 2023-12-22 LAB — BASIC METABOLIC PANEL
Anion gap: 9 (ref 5–15)
BUN: 17 mg/dL (ref 6–20)
CO2: 20 mmol/L — ABNORMAL LOW (ref 22–32)
Calcium: 8.9 mg/dL (ref 8.9–10.3)
Chloride: 108 mmol/L (ref 98–111)
Creatinine, Ser: 0.79 mg/dL (ref 0.44–1.00)
GFR, Estimated: >60 mL/min (ref >60)
Glucose, Bld: 120 mg/dL — ABNORMAL HIGH (ref 70–99)
Potassium: 4.4 mmol/L (ref 3.5–5.1)
Sodium: 137 mmol/L (ref 135–145)

## 2023-12-22 LAB — CBC
HCT: 40.3 % (ref 36.0–46.0)
Hemoglobin: 13.8 g/dL (ref 12.0–15.0)
MCH: 31.8 pg (ref 26.0–34.0)
MCHC: 34.2 g/dL (ref 30.0–36.0)
MCV: 92.9 fL (ref 80.0–100.0)
Platelets: 471 10*3/uL — ABNORMAL HIGH (ref 150–400)
RBC: 4.34 MIL/uL (ref 3.87–5.11)
RDW: 11.7 % (ref 11.5–15.5)
WBC: 10.5 10*3/uL (ref 4.0–10.5)
nRBC: 0 % (ref 0.0–0.2)

## 2023-12-22 MED ORDER — OXYCODONE HCL 5 MG PO TABS: 5.0000 mg | ORAL_TABLET | Freq: Four times a day (QID) | ORAL | 0 refills | Status: AC | PRN

## 2023-12-22 MED ORDER — AMOXICILLIN-POT CLAVULANATE 875-125 MG PO TABS: 1.0000 | ORAL_TABLET | Freq: Two times a day (BID) | ORAL | 0 refills | Status: DC

## 2023-12-22 MED ORDER — AMOXICILLIN-POT CLAVULANATE 875-125 MG PO TABS
1.0000 | ORAL_TABLET | Freq: Once | ORAL | Status: AC
  Administered 2023-12-22: 1 via ORAL
  Filled 2023-12-22: qty 1

## 2023-12-22 NOTE — Discharge Instructions (Addendum)
We evaluated you in the emergency department for your dog bite.  We discussed your injury with Dr. Ulice Bold, the on-call plastic surgeon.  She would like to see you in her office tomorrow at 8 AM to discuss next steps and evaluate your wound.  She does not think that we should try to repair it today, since this may result in a worse cosmetic result.  We have prescribed you antibiotics.  Please take these as prescribed.  We have also given you some saline to rinse out your lip.  Please rinse out your lip 4 times a day.  I do not think there should be any issue with eating but if you do notice any food particles in the area of your missing lip, please rinse this out with saline.  Please take Tylenol and Motrin for your symptoms at home.  You can take 1000 mg of Tylenol every 6 hours and 600 mg of ibuprofen every 6 hours as needed for your symptoms.  You can take these medicines together as needed, either at the same time, or alternating every 3 hours.  I have prescribed you small amount of oxycodone which you can take if your pain is unrelieved by Tylenol or Motrin.  Do not drink alcohol with this medicine and do not drive while taking oxycodone.  If you develop any new or worsening symptoms such as fevers or chills, increased swelling, trouble swallowing, or any other new symptoms, please return to the emergency department.

## 2023-12-22 NOTE — ED Triage Notes (Signed)
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 Dog bit pt bottom lip. Chunk of bottom lip missing from pt. Bleeding controlled. Pt states no pain when ice is on lip. Pain 8/10 without ice.  Pt states family member has the lip and is bring it to the ED. Pt denies any other pain or injury. Dog is up to date on all vaccinations. Pt is up to date on Rabies and tetanus.

## 2023-12-22 NOTE — ED Provider Notes (Signed)
[place] EMERGENCY DEPARTMENT AT [facility] Provider Note  CSN: [id number] Arrival date & time: 12/22/23 1215  Chief Complaint(s) Animal Bite  HPI Denise Galvan is a 28 y.o. female denies significant past medical history presenting to the emergency department with wound.  Patient works at a [employer] and a dog bit her on the lower lip.  There was a piece of the lip that was missing.  Dog is vaccinated against rabies and the patient is as well.  Patient reports her tetanus is also up-to-date.  No other injuries or wounds.  This happened today.   Past Medical History Past Medical History:  Diagnosis Date   Headache(784.0)    Patient Active Problem List   Diagnosis Date Noted   Lymphadenitis 10/16/2020   Bartonella infection 10/16/2020   Adjustment disorder with mixed anxiety and depressed mood 02/24/2020   LGSIL on Pap smear of cervix 01/06/2020   Increased frequency of urination 12/10/2018   Routine general medical examination at a health care facility 12/02/2018   Anxiety 05/28/2018   Low back pain 05/28/2018   Acne 05/28/2018   Migraine with aura 11/01/2013   Episodic tension type headache 11/01/2013   ADHD (attention deficit hyperactivity disorder) 07/25/1999   Home Medication(s) Prior to Admission medications   Medication Sig Start Date End Date Taking? Authorizing Provider  amoxicillin-clavulanate (AUGMENTIN) 875-125 MG tablet Take 1 tablet by mouth every 12 (twelve) hours. 12/22/23  Yes Lonell Grandchild, MD  cyclobenzaprine (FLEXERIL) 5 MG tablet Take 1-2 tablets (5-10 mg total) by mouth at bedtime. 10/06/20   Nche, Bonna Gains, NP  NORLYDA 0.35 MG tablet Take 1 tablet by mouth daily as needed. 03/06/21   [provider]  oxyCODONE (ROXICODONE) 5 MG immediate release tablet Take 1 tablet (5 mg total) by mouth every 6 (six) hours as needed for severe pain (pain score 7-10). 12/22/23  Yes Lonell Grandchild, MD                                                                                                                                     Past Surgical History History reviewed. No pertinent surgical history. Family History Family History  Problem Relation Age of Onset   Hyperlipidemia Father    Migraines Father    Other Father        Dyslipidemia   Heart disease Maternal Grandmother    Migraines Maternal Grandmother    Diabetes Maternal Grandmother    Hyperlipidemia Paternal Grandmother    Migraines Mother        Onset between the age of 4 or 52   Anxiety disorder Sister    Bladder Cancer Maternal Grandfather        Died at 25   Heart attack Paternal Grandfather        Died at 65   Other Paternal Grandfather        Staph infection  Stroke Paternal Grandfather    Migraines Maternal Aunt     Social History Social History   Tobacco Use   Smoking status: Passive Smoke Exposure - Never Smoker   Smokeless tobacco: Never  Vaping Use   Vaping status: Never Used  Substance Use Topics   Alcohol use: Yes    Alcohol/week: 0.0 standard drinks of alcohol    Comment: social   Drug use: No   Allergies Patient has no known allergies.  Review of Systems Review of Systems  All other systems reviewed and are negative.   Physical Exam Vital Signs  I have reviewed the triage vital signs BP (!) 143/102   Pulse 89   Temp 98.5 F (36.9 C) (Oral)   Resp 20   Wt 87.1 kg   SpO2 100%   BMI 32.45 kg/m  Physical Exam Vitals and nursing note reviewed.  Constitutional:      Appearance: Normal appearance.  HENT:     Head: Normocephalic and atraumatic.     Mouth/Throat:     Mouth: Mucous membranes are moist.     Comments: Approximately 1.5 cm avulsion of lip tissue of the right lower lip, no through and through wound into the oral mucosa.  No active bleeding.  Wound is adjacent to the vermilion border and may have a very slight involvement but seems mostly right at the border Eyes:     Conjunctiva/sclera:  Conjunctivae normal.  Cardiovascular:     Rate and Rhythm: Normal rate.  Pulmonary:     Effort: Pulmonary effort is normal. No respiratory distress.  Abdominal:     General: Abdomen is flat.  Musculoskeletal:        General: No deformity.  Skin:    General: Skin is warm and dry.     Capillary Refill: Capillary refill takes less than 2 seconds.  Neurological:     General: No focal deficit present.     Mental Status: She is alert. Mental status is at baseline.  Psychiatric:        Mood and Affect: Mood normal.        Behavior: Behavior normal.     ED Results and Treatments Labs (all labs ordered are listed, but only abnormal results are displayed) Labs Reviewed  BASIC METABOLIC PANEL - Abnormal; Notable for the following components:      Result Value   CO2 20 (*)    Glucose, Bld 120 (*)    All other components within normal limits  CBC - Abnormal; Notable for the following components:   Platelets 471 (*)    All other components within normal limits  HCG, SERUM, QUALITATIVE                                                                                                                          Radiology No results found.  Pertinent labs & imaging results that were available during my care of the patient were reviewed by me and considered  in my medical decision making (see MDM for details).  Medications Ordered in ED Medications  amoxicillin-clavulanate (AUGMENTIN) 875-125 MG per tablet 1 tablet (has no administration in time range)                                                                                                                                     Procedures Procedures  (including critical care time)  Medical Decision Making / ED Course   MDM:  28 year old presenting to the emergency department for lip injury.  Patient overall well-appearing, physical examination with avulsion injury to the right lower lip.  Injury abuts but does not go through the  vermilion border.  Given location, discussed with plastic surgeon Dr. Ulice Bold, she has reviewed the images and recommends that the patient follow-up tomorrow with her in clinic at 8 AM, given the tissue defect thinks primary repair would result in worse cosmesis at this time.  Patient is agreeable to this.  Will prescribe Augmentin.  Will give dose here.  Wound thoroughly irrigated by nursing.  Will give saline for rinsing at home.  Patient does not need tetanus or rabies vaccination.  Will discharge patient to home. All questions answered. Patient comfortable with plan of discharge. Return precautions discussed with patient and specified on the after visit summary.       Additional history obtained: -Additional history obtained from family -External records from outside source obtained and reviewed including: Chart review including previous notes, labs, imaging, consultation notes including prior vaccination   Lab Tests: -I ordered, reviewed, and interpreted labs.   The pertinent results include:   Labs Reviewed  BASIC METABOLIC PANEL - Abnormal; Notable for the following components:      Result Value   CO2 20 (*)    Glucose, Bld 120 (*)    All other components within normal limits  CBC - Abnormal; Notable for the following components:   Platelets 471 (*)    All other components within normal limits  HCG, SERUM, QUALITATIVE    Notable for nonspecific thrombocytosis    Medicines ordered and prescription drug management: Meds ordered this encounter  Medications   amoxicillin-clavulanate (AUGMENTIN) 875-125 MG per tablet 1 tablet   amoxicillin-clavulanate (AUGMENTIN) 875-125 MG tablet    Sig: Take 1 tablet by mouth every 12 (twelve) hours.    Dispense:  14 tablet    Refill:  0   oxyCODONE (ROXICODONE) 5 MG immediate release tablet    Sig: Take 1 tablet (5 mg total) by mouth every 6 (six) hours as needed for severe pain (pain score 7-10).    Dispense:  6 tablet    Refill:  0     -I have reviewed the patients home medicines and have made adjustments as needed   Consultations Obtained: I requested consultation with the plastic surgeon,  and discussed lab and imaging findings as well as pertinent plan - they recommend: follow up  outpatient setting tomorrow AM    Reevaluation: After the interventions noted above, I reevaluated the patient and found that their symptoms have improved  Co morbidities that complicate the patient evaluation  Past Medical History:  Diagnosis Date   Headache(784.0)       Dispostion: Disposition decision including need for hospitalization was considered, and patient discharged from emergency department.    Final Clinical Impression(s) / ED Diagnoses Final diagnoses:  Dog bite of face, initial encounter     This chart was dictated using voice recognition software.  Despite best efforts to proofread,  errors can occur which can change the documentation meaning.    Lonell Grandchild, MD 12/22/23 1710

## 2023-12-22 NOTE — ED Provider Triage Note (Signed)
Emergency Medicine Provider Triage Evaluation Note  Denise Galvan , a 28 y.o. female  was evaluated in triage.  Pt complains of animal bite.  Occurred about an hour ago.  Patient was bit by a dog at her work.  He works at [employer].  Bit the bottom of her left lip.  Family member has brought the other piece of the lip that was bitten off.  Patient states that the dog's vaccinations are up-to-date.  She also states that she was vaccinated for rabies and tetanus last year.  Review of Systems  Positive: See above Negative: See above  Physical Exam  BP (!) 143/102   Pulse 89   Temp 98.5 F (36.9 C) (Oral)   Resp 20   Wt 87.1 kg   SpO2 100%   BMI 32.45 kg/m  Gen:   Awake, no distress   Resp:  Normal effort  MSK:   Moves extremities without difficulty  Other:  Approximate 1.5 cm gaping bite wound to the lower lip  Medical Decision Making  Medically screening exam initiated at 12:37 PM.  Appropriate orders placed.  Denise Galvan was informed that the remainder of the evaluation will be completed by another provider, this initial triage assessment does not replace that evaluation, and the importance of remaining in the ED until their evaluation is complete.  See above   Gareth Eagle, PA-C 12/22/23 1240

## 2023-12-23 ENCOUNTER — Encounter: Payer: Self-pay | Admitting: Plastic Surgery

## 2023-12-23 ENCOUNTER — Ambulatory Visit: Payer: Worker's Compensation | Admitting: Plastic Surgery

## 2023-12-23 ENCOUNTER — Telehealth: Payer: Self-pay

## 2023-12-23 ENCOUNTER — Telehealth: Payer: Self-pay | Admitting: Plastic Surgery

## 2023-12-23 DIAGNOSIS — W540XXA Bitten by dog, initial encounter: Secondary | ICD-10-CM

## 2023-12-23 DIAGNOSIS — S0185XA Open bite of other part of head, initial encounter: Secondary | ICD-10-CM | POA: Diagnosis not present

## 2023-12-23 NOTE — Transitions of Care (Post Inpatient/ED Visit) (Signed)
   12/23/2023  Name: Denise Galvan MRN: [id number] DOB: February 23, 1996  Today's TOC FU Call Status: Today's TOC FU Call Status:: Unsuccessful Call (1st Attempt) Unsuccessful Call (1st Attempt) Date: 12/23/23  Attempted to reach the patient regarding the most recent Inpatient/ED visit.  Follow Up Plan: Additional outreach attempts will be made to reach the patient to complete the Transitions of Care (Post Inpatient/ED visit) call.   Signature Jodelle Green, RMA

## 2023-12-23 NOTE — Progress Notes (Signed)
     Patient ID: Denise Galvan, female    DOB: 1996-08-25, 28 y.o.   MRN: [id number]   Chief Complaint  Patient presents with   Skin Problem    The patient is a 28 year old female here for evaluation of her lower lip.  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  It appears that there was at least a 1.2 cm area of the red portion of the lip missing.  The muscle looks like it is very nicely intact.  We talked about different options for repair.  The fastest would be direct repair but it would likely leave her with a scar on the chin area and a smaller lower lip.    Review of Systems  Constitutional: Negative.   HENT: Negative.    Eyes: Negative.   Respiratory: Negative.    Cardiovascular: Negative.   Gastrointestinal: Negative.   Endocrine: Negative.   Genitourinary: Negative.   Musculoskeletal: Negative.     Past Medical History:  Diagnosis Date   Headache(784.0)     History reviewed. No pertinent surgical history.    Current Outpatient Medications:    amoxicillin-clavulanate (AUGMENTIN) 875-125 MG tablet, Take 1 tablet by mouth every 12 (twelve) hours., Disp: 14 tablet, Rfl: 0   cyclobenzaprine (FLEXERIL) 5 MG tablet, Take 1-2 tablets (5-10 mg total) by mouth at bedtime., Disp: 14 tablet, Rfl: 0   NORLYDA 0.35 MG tablet, Take 1 tablet by mouth daily as needed., Disp: , Rfl:    oxyCODONE (ROXICODONE) 5 MG immediate release tablet, Take 1 tablet (5 mg total) by mouth every 6 (six) hours as needed for severe pain (pain score 7-10)., Disp: 6 tablet, Rfl: 0   Objective:   There were no vitals filed for this visit.  Physical Exam Constitutional:      Appearance: Normal appearance.  HENT:     Head: Normocephalic.  Cardiovascular:     Rate and Rhythm: Normal rate.  Musculoskeletal:        General: Swelling and tenderness present.  Skin:    Capillary Refill: Capillary refill takes less than 2 seconds.     Findings: Bruising and lesion present.   Neurological:     Mental Status: She is alert and oriented to person, place, and time.  Psychiatric:        Mood and Affect: Mood normal.        Behavior: Behavior normal.        Thought Content: Thought content normal.        Judgment: Judgment normal.     Assessment & Plan:  Dog bite of face, initial encounter  Donated myriad was applied.  The patient feels comfortable doing this at home and I will plan to see her in a week.  If there is any question or any doubts she should just come back in and let us take a look at her.  She can also clean with Vashe and if the powder does not work she can go with Vaseline.  Pictures were obtained of the patient and placed in the chart with the patient's or guardian's permission.   Alena Bills Paul Torpey, DO

## 2023-12-23 NOTE — Telephone Encounter (Signed)
Called pt to make her aware that we will need the workers comp documents so that her claim can be covered through workers comp. Pt stated she will upload the documents in her mychart, pt was seen today 12-23-23 for and ED F/U

## 2023-12-26 NOTE — Telephone Encounter (Signed)
I called patient to follow up on recent visit to hospital and was about to leave a message and phone line disconnected.  I called back again and left message for patient to return call.

## 2023-12-26 NOTE — Transitions of Care (Post Inpatient/ED Visit) (Signed)
   12/26/2023  Name: Denise Galvan MRN: [id number] DOB: 05-12-1996  Today's TOC FU Call Status: Today's TOC FU Call Status:: Unsuccessful Call (3rd Attempt) Unsuccessful Call (1st Attempt) Date: 12/23/23 Unsuccessful Call (3rd Attempt) Date: 12/26/23  Attempted to reach the patient regarding the most recent Inpatient/ED visit.  Follow Up Plan: No further outreach attempts will be made at this time. We have been unable to contact the patient.  Signature BMS

## 2023-12-30 ENCOUNTER — Encounter: Payer: Self-pay | Admitting: Plastic Surgery

## 2023-12-30 ENCOUNTER — Ambulatory Visit (INDEPENDENT_AMBULATORY_CARE_PROVIDER_SITE_OTHER): Payer: Worker's Compensation | Admitting: Plastic Surgery

## 2023-12-30 DIAGNOSIS — W540XXA Bitten by dog, initial encounter: Secondary | ICD-10-CM | POA: Diagnosis not present

## 2023-12-30 DIAGNOSIS — S0185XA Open bite of other part of head, initial encounter: Secondary | ICD-10-CM | POA: Diagnosis not present

## 2023-12-30 NOTE — Progress Notes (Signed)
   Subjective:    Patient ID: Denise Galvan, female    DOB: [birth year], 28 y.o.   MRN: [id number]  The patient is a 28 year old female here for follow-up after a dog bite over a week ago.  She has been using donated myriad.  It is looking much better really good.  No sign of infection.  It might be a little difficult to get the myriad had on now.     Review of Systems  Constitutional: Negative.   HENT: Negative.    Eyes: Negative.   Respiratory: Negative.    Cardiovascular: Negative.   Gastrointestinal: Negative.   Endocrine: Negative.   Genitourinary: Negative.   Musculoskeletal: Negative.        Objective:   Physical Exam Cardiovascular:     Rate and Rhythm: Normal rate.  Pulmonary:     Effort: Pulmonary effort is normal.  Musculoskeletal:        General: Tenderness and deformity present.  Skin:    General: Skin is warm.     Findings: Bruising present.  Neurological:     Mental Status: She is alert and oriented to person, place, and time.  Psychiatric:        Mood and Affect: Mood normal.        Behavior: Behavior normal.        Thought Content: Thought content normal.        Judgment: Judgment normal.         Assessment & Plan:     ICD-10-CM   1. Dog bite of face, initial encounter  S01.85XA    W54.0XXA        Continue with Vaseline and keep the area clean.  I think that it still going to do some healing.  I will plan to see her back in 2 weeks. Epic was not working so we were not able to get pictures.

## 2024-01-13 ENCOUNTER — Ambulatory Visit (INDEPENDENT_AMBULATORY_CARE_PROVIDER_SITE_OTHER): Payer: Worker's Compensation | Admitting: Plastic Surgery

## 2024-01-13 ENCOUNTER — Encounter: Payer: Self-pay | Admitting: Plastic Surgery

## 2024-01-13 DIAGNOSIS — S0185XD Open bite of other part of head, subsequent encounter: Secondary | ICD-10-CM

## 2024-01-13 DIAGNOSIS — W540XXD Bitten by dog, subsequent encounter: Secondary | ICD-10-CM

## 2024-01-13 DIAGNOSIS — S0185XA Open bite of other part of head, initial encounter: Secondary | ICD-10-CM

## 2024-01-13 NOTE — Progress Notes (Signed)
   Subjective:    Patient ID: Denise Galvan, female    DOB: 07-Dec-1995, 28 y.o.   MRN: [id number]  The patient is a 28 year old female here with her mom for follow-up on her lower lip.  She had sustained a dog bite while at work in early February.  We have been working with her to see how much we could get it to heal without a revision so that it could build its structure back.  We were using donated myriad.  The patient is extremely pleased with the results.  It has a little further to go but it has filled and remarkably well.   Review of Systems  Constitutional: Negative.   Eyes: Negative.   Respiratory: Negative.    Cardiovascular: Negative.   Gastrointestinal: Negative.        Objective:   Physical Exam Constitutional:      Appearance: Normal appearance.  Musculoskeletal:        General: Deformity present. No swelling.  Skin:    General: Skin is warm.     Capillary Refill: Capillary refill takes less than 2 seconds.     Coloration: Skin is not jaundiced.     Findings: No bruising.  Neurological:     Mental Status: She is alert and oriented to person, place, and time.  Psychiatric:        Mood and Affect: Mood normal.        Behavior: Behavior normal.        Thought Content: Thought content normal.        Judgment: Judgment normal.        Assessment & Plan:     ICD-10-CM   1. Dog bite of face, initial encounter  S01.85XA    W54.0XXA        Will need to see the patient back in 1 month for reevaluation.  Sinew with Vaseline.  Pictures were obtained of the patient and placed in the chart with the patient's or guardian's permission.

## 2024-01-20 ENCOUNTER — Telehealth: Payer: Self-pay | Admitting: Plastic Surgery

## 2024-01-20 NOTE — Telephone Encounter (Signed)
 Called 01-20-24 lvmail to r/s provider out of office

## 2024-01-21 NOTE — Telephone Encounter (Signed)
 Called and left a VM.

## 2024-01-27 ENCOUNTER — Telehealth: Payer: Self-pay | Admitting: Plastic Surgery

## 2024-01-27 NOTE — Telephone Encounter (Signed)
 Called 3-25, left a VM, provider not in office

## 2024-02-24 ENCOUNTER — Ambulatory Visit: Admitting: Plastic Surgery

## 2024-04-28 ENCOUNTER — Encounter: Payer: Self-pay | Admitting: Nurse Practitioner

## 2024-10-06 ENCOUNTER — Ambulatory Visit: Admitting: Podiatry

## 2024-10-06 ENCOUNTER — Ambulatory Visit

## 2024-10-06 ENCOUNTER — Encounter: Payer: Self-pay | Admitting: Podiatry

## 2024-10-06 DIAGNOSIS — M722 Plantar fascial fibromatosis: Secondary | ICD-10-CM | POA: Diagnosis not present

## 2024-10-06 MED ORDER — MELOXICAM 15 MG PO TABS: 15.0000 mg | ORAL_TABLET | Freq: Every day | ORAL | 0 refills | Status: DC

## 2024-10-06 NOTE — Progress Notes (Signed)
  Subjective:  Patient ID: Denise Galvan, female    DOB: 1995-11-14,   MRN: [id number]  Chief Complaint  Patient presents with   Foot Pain    I'm having pain in the heel of my right foot.  It's a sharp shooting pain.  It's starting to happen in the left foot now too.    28 y.o. female presents for concern of bilateral heel pain that has been ongoing for quite some time.  She relates right foot is worse than the left.  She relates pain with for steps in the morning but also gets pain when not on her foot relates a sharp stabbing pain that goes through her heel and up into her knee.  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  She does relate occasional back pain as well.  She has tried inserts and anti-inflammatories to help but has found minimal relief.. Denies any other pedal complaints. Denies n/v/f/c.   Past Medical History:  Diagnosis Date   Headache(784.0)     Objective:  Physical Exam: Vascular: DP/PT pulses 2/4 bilateral. CFT <3 seconds. Normal hair growth on digits. No edema.  Skin. No lacerations or abrasions bilateral feet.  Musculoskeletal: MMT 5/5 bilateral lower extremities in DF, PF, Inversion and Eversion. Deceased ROM in DF of ankle joint. Tender to the medial calcaneal tubercle bilaterally . No pain with achilles, PT or arch. No pain with calcaneal squeeze.  Neurological: Sensation intact to light touch.   Assessment:   1. Plantar fasciitis      Plan:  Patient was evaluated and treated and all questions answered. Discussed plantar fasciitis with patient.  X-rays reviewed and discussed with patient. No acute fractures or dislocations noted. Mild spurring noted at inferior calcaneus.  Discussed treatment options including, ice, NSAIDS, supportive shoes, bracing, and stretching. Stretching exercises provided to be done on a daily basis.   Prescription for meloxicam provided and sent to pharmacy.   Kidney function labs reviewed and within normal limits. Plantar fascia brace is dispensed Follow-up 6 weeks or sooner if any problems arise. In the meantime, encouraged to call the office with any questions, concerns, change in symptoms.      Asberry Failing, DPM

## 2024-10-06 NOTE — Patient Instructions (Signed)

## 2024-11-17 ENCOUNTER — Encounter: Payer: Self-pay | Admitting: Podiatry

## 2024-11-17 ENCOUNTER — Ambulatory Visit: Admitting: Podiatry

## 2024-11-17 DIAGNOSIS — M722 Plantar fascial fibromatosis: Secondary | ICD-10-CM

## 2024-11-17 MED ORDER — MELOXICAM 15 MG PO TABS: 15.0000 mg | ORAL_TABLET | Freq: Every day | ORAL | 1 refills | Status: AC | PRN

## 2024-11-17 NOTE — Progress Notes (Signed)
"  °  Subjective:  Patient ID: Denise Galvan, female    DOB: 07-Jul-1996,   MRN: [id number]  Chief Complaint  Patient presents with   Plantar Fasciitis    They're okay.  They're alright.    29 y.o. female presents for follow-up of bilateral plantar fasciitis.  Relates she is doing better about 70%.  She has been stretching she has been wearing the brace and she has been taking the meloxicam  which all seem to help.  She does still have pain at points throughout the day but overall improving.  Denies any other pedal complaints. Denies n/v/f/c.   Past Medical History:  Diagnosis Date   Headache(784.0)     Objective:  Physical Exam: Vascular: DP/PT pulses 2/4 bilateral. CFT <3 seconds. Normal hair growth on digits. No edema.  Skin. No lacerations or abrasions bilateral feet.  Musculoskeletal: MMT 5/5 bilateral lower extremities in DF, PF, Inversion and Eversion. Deceased ROM in DF of ankle joint.  Minimally tender to the medial calcaneal tubercle bilaterally . No pain with achilles, PT or arch. No pain with calcaneal squeeze.  Neurological: Sensation intact to light touch.   Assessment:   1. Plantar fasciitis, left   2. Plantar fasciitis, right      Plan:  Patient was evaluated and treated and all questions answered. Discussed plantar fasciitis with patient.  X-rays reviewed and discussed with patient. No acute fractures or dislocations noted. Mild spurring noted at inferior calcaneus.  Discussed treatment options including, ice, NSAIDS, supportive shoes, bracing, and stretching. Continue stretching and support. Meloxicam  refilled for to be taken as needed.  Discussed patient should get 100% mark if she has any trouble return and would consider injection. Follow-up  as needed     Asberry Failing, DPM    "

## 2024-11-29 ENCOUNTER — Ambulatory Visit: Payer: Self-pay

## 2024-12-01 ENCOUNTER — Ambulatory Visit

## 2024-12-01 VITALS — BP 110/70 | HR 64 | Ht 64.0 in | Wt 205.0 lb

## 2024-12-01 DIAGNOSIS — F339 Major depressive disorder, recurrent, unspecified: Secondary | ICD-10-CM | POA: Insufficient documentation

## 2024-12-01 DIAGNOSIS — Z113 Encounter for screening for infections with a predominantly sexual mode of transmission: Secondary | ICD-10-CM | POA: Diagnosis not present

## 2024-12-01 DIAGNOSIS — Z0000 Encounter for general adult medical examination without abnormal findings: Secondary | ICD-10-CM | POA: Diagnosis not present

## 2024-12-01 DIAGNOSIS — R42 Dizziness and giddiness: Secondary | ICD-10-CM | POA: Diagnosis not present

## 2024-12-01 DIAGNOSIS — E7800 Pure hypercholesterolemia, unspecified: Secondary | ICD-10-CM

## 2024-12-01 NOTE — Progress Notes (Signed)
" ° ° °  SUBJECTIVE:   Chief compliant/HPI: annual examination  Denise Galvan is a 29 y.o. who presents today for an annual exam.   Review of systems form notable for lightheadedness, episodic. Has been checking CBG with episodes. Around 56 when she checks.   Updated history tabs and problem list.   No tobacco/vape/recreational drug use. Drinks 5 acholic beverages per week. Several times in the past year >4 drinks in 24 hours. Sexually active with 4 female partners in the last year. Uses condoms and birth control (progesterone only pill prescribed through OB).  H/o abnormal paps. Colposcopy recently, reported benign. Paps/colpos managed with OB  Maternal grandfather passed from colon cancer in 50-60s.   OBJECTIVE:   BP 110/70   Pulse 64   Ht 5' 4 (1.626 m)   Wt 205 lb (93 kg)   LMP 11/09/2024   SpO2 98%   BMI 35.19 kg/m    General: well appearing female CV: regular rate Pulm: breathing comfortably on room air MSK: normal gait, no focal muscle defecits  ASSESSMENT/PLAN:   Assessment & Plan Routine adult health maintenance A1c today.  Cervical cancer screening through OB.  No first degree relative with cancer.  Will assess liver function with moderate alcohol use.  Routine screening for STI (sexually transmitted infection) STI urine screen at [facility] recently.  HIV, RPR, Hep C today. Continue birth control, per OB. Discussed importance of barrier contraception to prevent STI, of which patient is aware.  Hypercholesterolemia Lipid panel obtained today. Lightheadedness Obtain preliminary labs: CBC, CMP, TSH Patient will make follow up appointment at her earliest convenience for further assessment to include orthostatic vitals and lab review.   Haroldine Redler Alena Morrison, MD [facility]  "

## 2024-12-01 NOTE — Assessment & Plan Note (Signed)
Lipid panel obtained today.  

## 2024-12-02 ENCOUNTER — Ambulatory Visit: Payer: Self-pay

## 2024-12-02 DIAGNOSIS — Z6835 Body mass index (BMI) 35.0-35.9, adult: Secondary | ICD-10-CM

## 2024-12-02 DIAGNOSIS — E7800 Pure hypercholesterolemia, unspecified: Secondary | ICD-10-CM

## 2024-12-02 DIAGNOSIS — D75839 Thrombocytosis, unspecified: Secondary | ICD-10-CM

## 2024-12-02 LAB — CBC
Hematocrit: 42.3 % (ref 34.0–46.6)
Hemoglobin: 14.3 g/dL (ref 11.1–15.9)
MCH: 31.6 pg (ref 26.6–33.0)
MCHC: 33.8 g/dL (ref 31.5–35.7)
MCV: 94 fL (ref 79–97)
Platelets: 498 10*3/uL — ABNORMAL HIGH (ref 150–450)
RBC: 4.52 x10E6/uL (ref 3.77–5.28)
RDW: 11.7 % (ref 11.7–15.4)
WBC: 8.8 10*3/uL (ref 3.4–10.8)

## 2024-12-02 LAB — COMPREHENSIVE METABOLIC PANEL WITH GFR
ALT: 18 [IU]/L (ref 0–32)
AST: 17 [IU]/L (ref 0–40)
Albumin: 4.3 g/dL (ref 4.0–5.0)
Alkaline Phosphatase: 71 [IU]/L (ref 41–116)
BUN/Creatinine Ratio: 15 (ref 9–23)
BUN: 12 mg/dL (ref 6–20)
Bilirubin Total: 0.4 mg/dL (ref 0.0–1.2)
CO2: 18 mmol/L — ABNORMAL LOW (ref 20–29)
Calcium: 9.1 mg/dL (ref 8.7–10.2)
Chloride: 104 mmol/L (ref 96–106)
Creatinine, Ser: 0.8 mg/dL (ref 0.57–1.00)
Globulin, Total: 2.6 g/dL (ref 1.5–4.5)
Glucose: 86 mg/dL (ref 70–99)
Potassium: 4.3 mmol/L (ref 3.5–5.2)
Sodium: 137 mmol/L (ref 134–144)
Total Protein: 6.9 g/dL (ref 6.0–8.5)
eGFR: 102 mL/min/{1.73_m2}

## 2024-12-02 LAB — TSH: TSH: 1.18 u[IU]/mL (ref 0.450–4.500)

## 2024-12-02 LAB — HEMOGLOBIN A1C
Est. average glucose Bld gHb Est-mCnc: 108 mg/dL
Hgb A1c MFr Bld: 5.4 % (ref 4.8–5.6)

## 2024-12-02 LAB — LIPID PANEL
Chol/HDL Ratio: 6.1 ratio — ABNORMAL HIGH (ref 0.0–4.4)
Cholesterol, Total: 231 mg/dL — ABNORMAL HIGH (ref 100–199)
HDL: 38 mg/dL — ABNORMAL LOW
LDL Chol Calc (NIH): 164 mg/dL — ABNORMAL HIGH (ref 0–99)
Triglycerides: 158 mg/dL — ABNORMAL HIGH (ref 0–149)
VLDL Cholesterol Cal: 29 mg/dL (ref 5–40)

## 2024-12-02 LAB — HEPATITIS C ANTIBODY: Hep C Virus Ab: NONREACTIVE

## 2024-12-02 LAB — HIV ANTIBODY (ROUTINE TESTING W REFLEX): HIV Screen 4th Generation wRfx: NONREACTIVE

## 2024-12-02 LAB — SYPHILIS: RPR W/REFLEX TO RPR TITER AND TREPONEMAL ANTIBODIES, TRADITIONAL SCREENING AND DIAGNOSIS ALGORITHM: RPR Ser Ql: NONREACTIVE

## 2024-12-06 ENCOUNTER — Telehealth: Payer: Self-pay

## 2024-12-06 NOTE — Telephone Encounter (Signed)
 Call to give information about the [place] prep program. Left a VM to call me back.

## 2024-12-08 ENCOUNTER — Ambulatory Visit: Payer: Self-pay | Admitting: Student

## 2024-12-08 ENCOUNTER — Encounter: Payer: Self-pay | Admitting: Student

## 2024-12-08 VITALS — BP 119/72 | HR 78 | Ht 64.0 in | Wt 205.0 lb

## 2024-12-08 DIAGNOSIS — G44219 Episodic tension-type headache, not intractable: Secondary | ICD-10-CM

## 2024-12-08 DIAGNOSIS — D75839 Thrombocytosis, unspecified: Secondary | ICD-10-CM

## 2024-12-08 NOTE — Assessment & Plan Note (Addendum)
 Headaches appear well controlled at this time. No red flag symptoms on exam.  Discussed avoiding medication overuse headaches  Prescribed c-spine exercises and stretching, patient encouraged to participate at least 5 times per week for benefit

## 2024-12-08 NOTE — Patient Instructions (Signed)
 It was great to see you today!   I have ordered lab work today. I will send you a message through MyChart or send you a letter with your results. If there is an abnormal result, I will give you a call.    No future appointments.  Please arrive 15 minutes before your appointment to ensure smooth check in process.  If you are more than 15 minutes late, you may be asked to reschedule.   Please bring a list of your medications with you to all appointments.   Please call the clinic at [phone] if your symptoms worsen or you have any concerns.  Thank you for allowing me to participate in your care, Dr. Damien Pinal [facility]

## 2024-12-08 NOTE — Progress Notes (Signed)
" ° ° °  SUBJECTIVE:   CHIEF COMPLAINT / HPI:   Denise Galvan is a 29 y.o. female presenting for follow up for intermittent dizziness.   She reports intermittent lightheadedness and dizziness that occur when she goes long periods without eating and sometimes shortly after meals. She is increasing her water intake for hydration.  She has frequent sharp headaches behind her left eye or in the center of her head, rated 7-8/10. They occur with or without lightheadedness. She had migraines with aura as a child, but current headaches lack visual changes or nausea. Excedrin Migraine, two tablets in the morning, usually relieves the pain, though she sometimes needs a second dose in the evening if it returns.  She notes numbness and tingling in her toes with tight shoes, numbness in her left thigh after sitting, and loss of feeling in her fingers while playing Xbox. These resolve with repositioning or stretching.  Her migraines became much less frequent after starting birth control in college. She is not taking vitamins or supplements.  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  PERTINENT  PMH / PSH: reviewed and updated.  OBJECTIVE:   BP 119/72   Pulse 78   Ht 5' 4 (1.626 m)   Wt 205 lb (93 kg)   LMP 11/09/2024   SpO2 99%   BMI 35.19 kg/m   Well-appearing, no acute distress Cardio: Regular rate, regular rhythm, no murmurs on exam. Pulm: Clear, no wheezing, no crackles. No increased work of breathing Abdominal: bowel sounds present, soft, non-tender, non-distended Extremities: no peripheral edema   Neuro: CN II: PERRL CN III, IV,VI: EOMI CV V: Normal sensation in V1, V2, V3 CVII: Symmetric smile and brow raise CN VIII: Normal hearing CN IX,X: Symmetric palate raise  CN XI: 5/5 shoulder shrug CN XII: Symmetric tongue protrusion  UE and LE strength 5/5 2+ UE and LE reflexes  Normal sensation in UE and LE bilaterally  No ataxia with  finger to nose, normal heel to shin  Negative Rhomberg    ASSESSMENT/PLAN:   Assessment & Plan Thrombocytosis Blood smear, CBC w diff, iron, TIBC, and Ferritin collected today Will forward to PCP for continued management  Episodic tension-type headache, not intractable Headaches appear well controlled at this time. No red flag symptoms on exam.  Discussed avoiding medication overuse headaches  Prescribed c-spine exercises and stretching, patient encouraged to participate at least 5 times per week for benefit      Damien Pinal, DO [facility]  "

## 2024-12-09 ENCOUNTER — Encounter (INDEPENDENT_AMBULATORY_CARE_PROVIDER_SITE_OTHER): Payer: Self-pay

## 2024-12-09 LAB — CBC WITH DIFFERENTIAL/PLATELET
Basophils Absolute: 0 10*3/uL (ref 0.0–0.2)
Basos: 0 %
EOS (ABSOLUTE): 0.2 10*3/uL (ref 0.0–0.4)
Eos: 2 %
Hematocrit: 41.7 % (ref 34.0–46.6)
Hemoglobin: 14 g/dL (ref 11.1–15.9)
Immature Grans (Abs): 0 10*3/uL (ref 0.0–0.1)
Immature Granulocytes: 0 %
Lymphocytes Absolute: 3.2 10*3/uL — ABNORMAL HIGH (ref 0.7–3.1)
Lymphs: 31 %
MCH: 31.7 pg (ref 26.6–33.0)
MCHC: 33.6 g/dL (ref 31.5–35.7)
MCV: 94 fL (ref 79–97)
Monocytes Absolute: 0.5 10*3/uL (ref 0.1–0.9)
Monocytes: 5 %
Neutrophils Absolute: 6.4 10*3/uL (ref 1.4–7.0)
Neutrophils: 62 %
Platelets: 526 10*3/uL — ABNORMAL HIGH (ref 150–450)
RBC: 4.42 x10E6/uL (ref 3.77–5.28)
RDW: 11.8 % (ref 11.7–15.4)
WBC: 10.3 10*3/uL (ref 3.4–10.8)

## 2024-12-09 LAB — IRON,TIBC AND FERRITIN PANEL
Ferritin: 168 ng/mL — ABNORMAL HIGH (ref 15–150)
Iron Saturation: 38 % (ref 15–55)
Iron: 132 ug/dL (ref 27–159)
Total Iron Binding Capacity: 346 ug/dL (ref 250–450)
UIBC: 214 ug/dL (ref 131–425)
